# Patient Record
Sex: Male | Born: 1975 | Race: Black or African American | Hispanic: No | Smoking: Never smoker
Health system: Southern US, Community
[De-identification: ages and names within clinical notes are randomized; demographics above are authoritative.]

## PROBLEM LIST (undated history)

## (undated) DIAGNOSIS — K529 Noninfective gastroenteritis and colitis, unspecified: Secondary | ICD-10-CM

## (undated) DIAGNOSIS — F4310 Post-traumatic stress disorder, unspecified: Secondary | ICD-10-CM

## (undated) HISTORY — PX: KNEE SURGERY: SHX244

---

## 2018-08-19 ENCOUNTER — Encounter (HOSPITAL_COMMUNITY): Payer: Self-pay

## 2018-08-19 ENCOUNTER — Other Ambulatory Visit: Payer: Self-pay

## 2018-08-19 ENCOUNTER — Emergency Department (HOSPITAL_COMMUNITY): Payer: No Typology Code available for payment source

## 2018-08-19 ENCOUNTER — Emergency Department (HOSPITAL_COMMUNITY)
Admission: EM | Admit: 2018-08-19 | Discharge: 2018-08-19 | Disposition: A | Discharge status: Home / Self Care | Payer: No Typology Code available for payment source | Attending: Emergency Medicine | Admitting: Emergency Medicine

## 2018-08-19 DIAGNOSIS — F17220 Nicotine dependence, chewing tobacco, uncomplicated: Secondary | ICD-10-CM | POA: Diagnosis not present

## 2018-08-19 DIAGNOSIS — R079 Chest pain, unspecified: Secondary | ICD-10-CM | POA: Insufficient documentation

## 2018-08-19 HISTORY — DX: Noninfective gastroenteritis and colitis, unspecified: K52.9

## 2018-08-19 HISTORY — DX: Post-traumatic stress disorder, unspecified: F43.10

## 2018-08-19 LAB — BASIC METABOLIC PANEL
Anion gap: 9 (ref 5–15)
BUN: 14 mg/dL (ref 6–20)
CO2: 22 mmol/L (ref 22–32)
Calcium: 9.4 mg/dL (ref 8.9–10.3)
Chloride: 107 mmol/L (ref 98–111)
Creatinine, Ser: 1.03 mg/dL (ref 0.61–1.24)
GFR calc Af Amer: >60 mL/min (ref >60)
GFR calc non Af Amer: >60 mL/min (ref >60)
Glucose, Bld: 122 mg/dL — ABNORMAL HIGH (ref 70–99)
Potassium: 3.7 mmol/L (ref 3.5–5.1)
Sodium: 138 mmol/L (ref 135–145)

## 2018-08-19 LAB — CBC
HCT: 40.6 % (ref 39.0–52.0)
Hemoglobin: 14 g/dL (ref 13.0–17.0)
MCH: 29.9 pg (ref 26.0–34.0)
MCHC: 34.5 g/dL (ref 30.0–36.0)
MCV: 86.8 fL (ref 80.0–100.0)
Platelets: 243 10*3/uL (ref 150–400)
RBC: 4.68 MIL/uL (ref 4.22–5.81)
RDW: 12.3 % (ref 11.5–15.5)
WBC: 8.2 10*3/uL (ref 4.0–10.5)
nRBC: 0 % (ref 0.0–0.2)

## 2018-08-19 LAB — TROPONIN I: Troponin I: <0.03 ng/mL (ref <0.03)

## 2018-08-19 IMAGING — DX PORTABLE CHEST - 1 VIEW
1 series · 1 of 1 positions shown · non-contrast
Comparison: None available.

CLINICAL DATA: Initial evaluation for acute chest pain.

EXAM:
PORTABLE CHEST 1 VIEW

[chest ap]
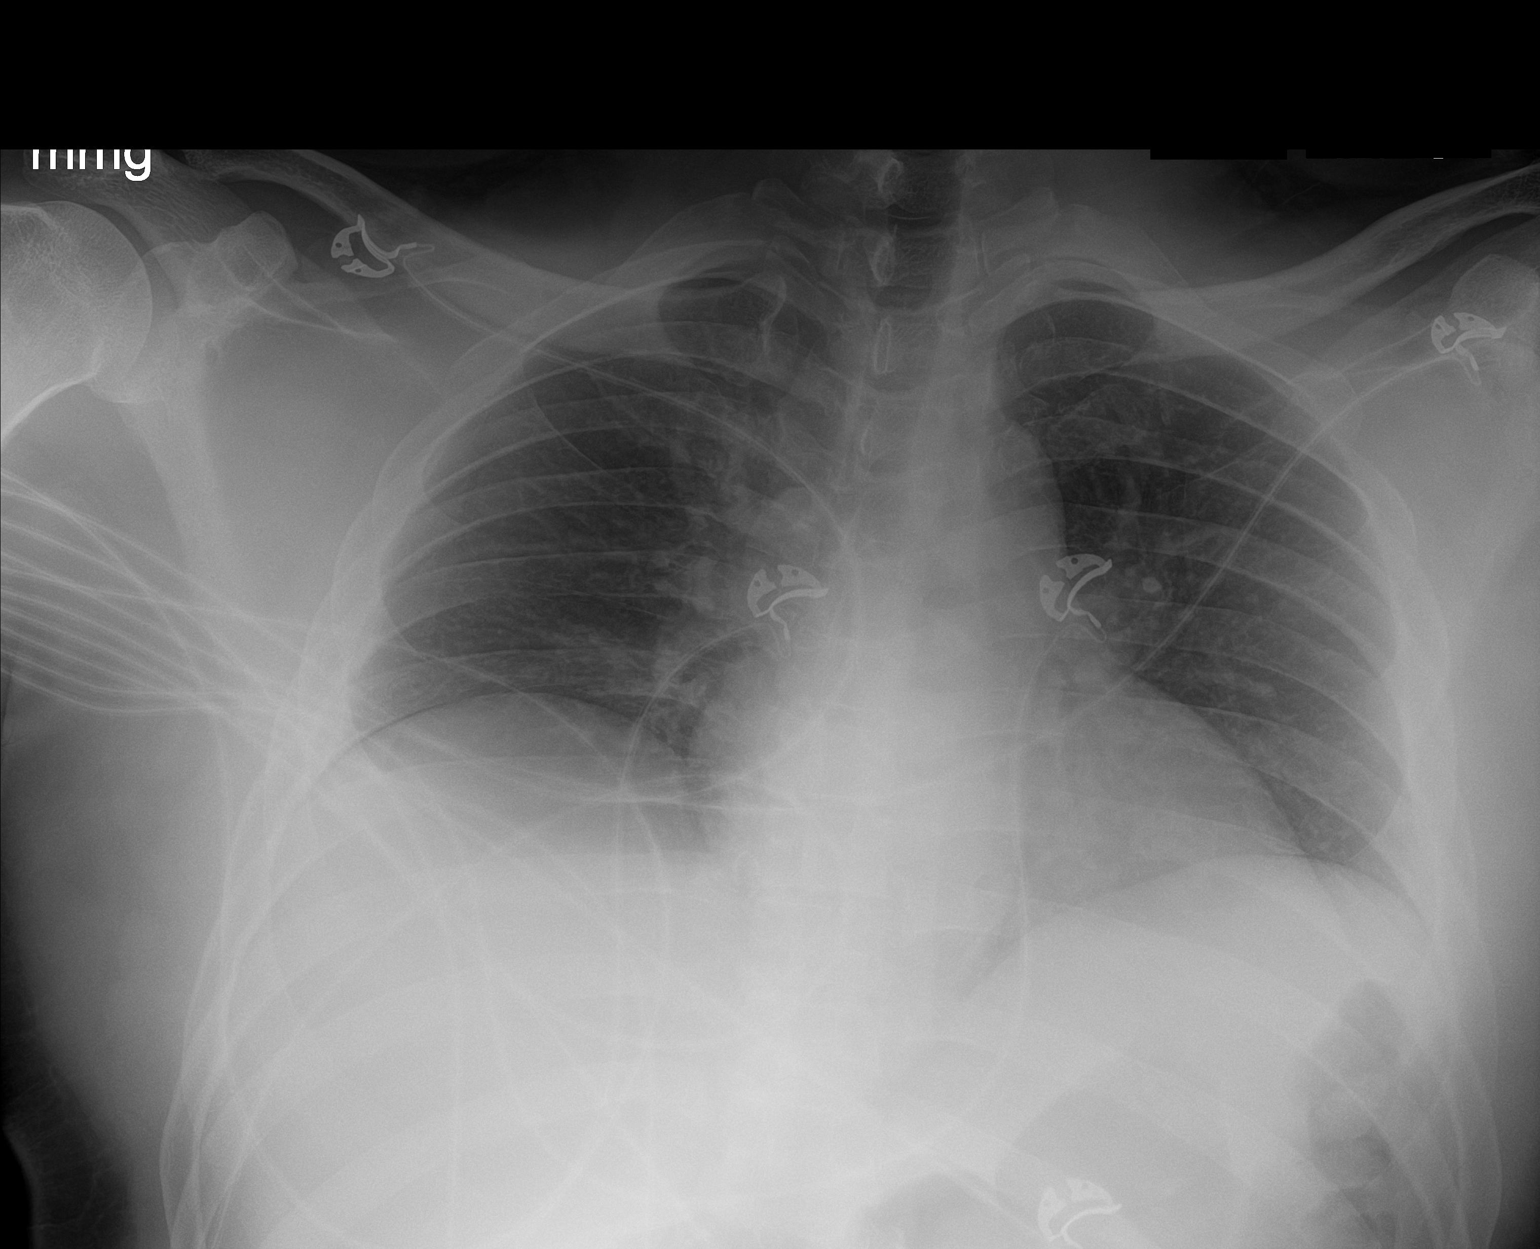

[1 of 1 positions shown; findings below may reference images not displayed]

FINDINGS: Exaggeration of the cardiac silhouette related AP technique and low
lung volumes. Transverse heart size felt to be within normal limits.
Mediastinal silhouette normal.

Lungs hypoinflated. Secondary bibasilar bronchovascular crowding. No
focal infiltrates. No pulmonary edema or pleural effusion. No
pneumothorax.

No acute osseous finding.
IMPRESSION: 1. Shallow lung inflation with secondary bibasilar bronchovascular
crowding.
2. No other active cardiopulmonary disease.

## 2018-08-19 NOTE — Discharge Instructions (Signed)
Follow up with your PCP/[facility] provider. Return to ER for new or worsening symptoms.

## 2018-08-19 NOTE — ED Provider Notes (Signed)
[facility] EMERGENCY DEPARTMENT Provider Note   CSN: [id number] Arrival date & time: 08/19/18  1459    History   Chief Complaint Chief Complaint  Patient presents with  . Chest Pain    HPI Donald Reese is a 43 y.o. male.     43yo male with complaint of SHOB, CP, headaches. Patient reports symptoms have been ongoing for several years, recently had a sleep study through the [place] and was diagnosed with sleep apnea, due to [place] has had difficulty following up and obtaining a CPAP machine. About 2-3 months ago states began to wake at night feeling like he can't breath, wakes up feeling like he's choking, heart racing, feels like he's been sprinting and then develops a headache. Last night had same symptoms at 8PM when not sleeping, occurred while sitting on the sofa watching TV. This episode prompted patient to follow up with his doctor at the [place] today, had an EKG that was abnormal and he was sent to the ER. At this time feels like he needs to take a deep breath on occasion, left side of chest feels "funny" since last night, constant, nothing makes symptoms worse, feels better when lying down. Non exertional.  Denies nausea, vomiting, abdominal pain, diaphoresis.  Denies history of high blood pressure or high cholesterol. Non smoker (states never smoker). Family history significant for MI/CVA in grandparents in old age.      Past Medical History:  Diagnosis Date  . Colitis   . PTSD (post-traumatic stress disorder)       Home Medications    Prior to Admission medications   Not on File    Family History No family history on file.  Social History Social History   Tobacco Use  . Smoking status: Never Smoker  . Smokeless tobacco: Former Systems developer    Types: Chew  Substance Use Topics  . Alcohol use: Not on file    Comment: occassionally  . Drug use: Never     Allergies   Patient has no allergy information on record.   Review of Systems Review of Systems   Constitutional: Negative for fever.  Respiratory: Positive for shortness of breath.   Cardiovascular: Positive for chest pain.  Gastrointestinal: Negative for abdominal pain, nausea and vomiting.  Skin: Negative for rash and wound.  Allergic/Immunologic: Positive for immunocompromised state.  Neurological: Positive for headaches.  Psychiatric/Behavioral: Negative for confusion.  All other systems reviewed and are negative.    Physical Exam Updated Vital Signs BP 111/72   Pulse 76   Temp 98.2 F (36.8 C) (Oral)   Resp 16   SpO2 97%   Physical Exam Vitals signs and nursing note reviewed.  Constitutional:      General: He is not in acute distress.    Appearance: He is well-developed. He is not diaphoretic.  HENT:     Head: Normocephalic and atraumatic.  Cardiovascular:     Rate and Rhythm: Normal rate and regular rhythm.     Pulses:          Carotid pulses are 2+ on the right side and 2+ on the left side.      Radial pulses are 2+ on the right side and 2+ on the left side.       Dorsalis pedis pulses are 2+ on the right side and 2+ on the left side.       Posterior tibial pulses are 2+ on the right side and 2+ on the left side.  Heart sounds: Normal heart sounds. No murmur.  Pulmonary:     Effort: Pulmonary effort is normal.     Breath sounds: Normal breath sounds. No decreased breath sounds.  Chest:     Chest wall: No tenderness.  Abdominal:     Palpations: Abdomen is soft.     Tenderness: There is no abdominal tenderness.  Musculoskeletal:     Right lower leg: No edema.     Left lower leg: No edema.  Skin:    General: Skin is warm and dry.     Findings: No erythema.  Neurological:     Mental Status: He is alert and oriented to person, place, and time.  Psychiatric:        Behavior: Behavior normal.      ED Treatments / Results  Labs (all labs ordered are listed, but only abnormal results are displayed) Labs Reviewed  BASIC METABOLIC PANEL - Abnormal;  Notable for the following components:      Result Value   Glucose, Bld 122 (*)    All other components within normal limits  CBC  TROPONIN I    EKG EKG Interpretation  Date/Time:  Friday August 19 2018 15:09:06 EDT Ventricular Rate:  83 PR Interval:    QRS Duration: 144 QT Interval:  389 QTC Calculation: 458 R Axis:   -84 Text Interpretation:  Sinus rhythm RBBB and LAFB No previous ECGs available Confirmed by Richardean CanalYao, David H ([id number]) on 08/19/2018 3:36:07 PM   Radiology Dg Chest Port 1 View  Result Date: 08/19/2018 CLINICAL DATA:  Initial evaluation for acute chest pain. EXAM: PORTABLE CHEST 1 VIEW COMPARISON:  None available. FINDINGS: Exaggeration of the cardiac silhouette related AP technique and low lung volumes. Transverse heart size felt to be within normal limits. Mediastinal silhouette normal. Lungs hypoinflated. Secondary bibasilar bronchovascular crowding. No focal infiltrates. No pulmonary edema or pleural effusion. No pneumothorax. No acute osseous finding. IMPRESSION: 1. Shallow lung inflation with secondary bibasilar bronchovascular crowding. 2. No other active cardiopulmonary disease. Electronically Signed   By: Rise MuBenjamin  McClintock M.D.   On: 08/19/2018 16:16    Procedures Procedures (including critical care time)  Medications Ordered in ED Medications - No data to display   Initial Impression / Assessment and Plan / ED Course  I have reviewed the triage vital signs and the nursing notes.  Pertinent labs & imaging results that were available during my care of the patient were reviewed by me and considered in my medical decision making (see chart for details).  Clinical Course as of Aug 19 1743  Fri Aug 19, 2018  1742 42yo male, recently diagnosed with OSA, needs CPAP but due to COVID has had delay in obtaining follow up. Reports long history of CP, SHOB, headaches that wake him from his sleep at night. [facility] since 8PM last  night, had abnormal EKG without previus for comparison and was sent to the ER.    [LM]  1743 EKG with RBBB and LAFB, no acute ischemic changes. Non smoker, no HTN, no history of high cholesterol, no significant family history. Trop negative, CBC and BMP without significant findings. Case discussed with Dr. Silverio LayYao, patient will be dc to follow up with PCP, return to ER for worsening or concerning symptoms.    [LM]    Clinical Course User Index [LM] Jeannie FendMurphy,  A, PA-C      Final Clinical Impressions(s) / ED Diagnoses   Final diagnoses:  Nonspecific  chest pain    ED Discharge Orders    None       Alden HippMurphy,  A, PA-C 08/19/18 1745    Charlynne PanderYao, David Hsienta, MD 08/19/18 [id number]

## 2018-08-19 NOTE — ED Notes (Signed)
Patient verbalizes understanding of discharge instructions. Opportunity for questioning and answers were provided. Armband removed by staff, pt discharged from ED.  

## 2018-08-19 NOTE — ED Triage Notes (Addendum)
Pt from the [place] w/ a CP that has been intermittent for the past two months. The pain is located on the left side of his chest and does not radiate. Pain is not reproducible upon palpation. Additional complaints of SOB, nausea, and lightheadedness. No vomiting. Pt reports that the pain wakes him up every morning around 2-4 am.   Pt reports recent sleep study for possible sleep apnea. Pt reports he wakes up numerous times each night with difficulty breathing, chest pain, and migraines.

## 2018-08-22 ENCOUNTER — Telehealth: Payer: Self-pay | Admitting: *Deleted

## 2018-08-22 NOTE — Telephone Encounter (Signed)
[facility] transitions coordinator called for COVID results, if any.  EDCM reviewed chart and did not find that COVID was performed.  Relayed information to transitions coordinator.

## 2019-03-15 ENCOUNTER — Encounter (HOSPITAL_COMMUNITY): Payer: Self-pay | Admitting: Emergency Medicine

## 2019-03-15 ENCOUNTER — Emergency Department (HOSPITAL_COMMUNITY)
Admission: EM | Admit: 2019-03-15 | Discharge: 2019-03-15 | Disposition: A | Discharge status: Home / Self Care | Payer: No Typology Code available for payment source | Attending: Emergency Medicine | Admitting: Emergency Medicine

## 2019-03-15 ENCOUNTER — Other Ambulatory Visit: Payer: Self-pay

## 2019-03-15 DIAGNOSIS — U071 COVID-19: Secondary | ICD-10-CM | POA: Diagnosis not present

## 2019-03-15 DIAGNOSIS — Z20822 Contact with and (suspected) exposure to covid-19: Secondary | ICD-10-CM

## 2019-03-15 DIAGNOSIS — M7918 Myalgia, other site: Secondary | ICD-10-CM | POA: Diagnosis present

## 2019-03-15 DIAGNOSIS — R05 Cough: Secondary | ICD-10-CM | POA: Diagnosis not present

## 2019-03-15 DIAGNOSIS — R0602 Shortness of breath: Secondary | ICD-10-CM | POA: Diagnosis not present

## 2019-03-15 DIAGNOSIS — R509 Fever, unspecified: Secondary | ICD-10-CM | POA: Insufficient documentation

## 2019-03-15 LAB — POC SARS CORONAVIRUS 2 AG -  ED: SARS Coronavirus 2 Ag: NEGATIVE

## 2019-03-15 MED ORDER — ACETAMINOPHEN 325 MG PO TABS
650.0000 mg | ORAL_TABLET | Freq: Once | ORAL | Status: AC | PRN
  Administered 2019-03-15: 650 mg via ORAL
  Filled 2019-03-15: qty 2

## 2019-03-15 NOTE — ED Notes (Signed)
Pt given dc instructions pt verbalizes understanding.  

## 2019-03-15 NOTE — ED Triage Notes (Signed)
Pt reports not being able to taste or smell for a couple days. Endorses generalized body aches.

## 2019-03-15 NOTE — ED Provider Notes (Signed)
[facility] EMERGENCY DEPARTMENT Provider Note   CSN: [id number] Arrival date & time: 03/15/19  1242     History Chief Complaint  Patient presents with  . Generalized Body Aches    Donald Reese is a 44 y.o. male.  HPI   44 year old male with a history of ulcerative colitis, PTSD, who presents emergency department today for evaluation of loss of smell/taste.  He also reports a mild sore throat, generalized body aches, malaise and fevers.  He has a mild intermittent cough and intermittent shortness of breath that he states is mild.  He denies any chest pain, vomiting.  He was having some loose stools earlier this week that have since resolved.  He is somewhat nauseated.  Denies any abdominal pain.  He reports he was recently around someone with Covid a few days ago.  Past Medical History:  Diagnosis Date  . Colitis   . PTSD (post-traumatic stress disorder)     There are no problems to display for this patient.   Past Surgical History:  Procedure Laterality Date  . KNEE SURGERY         No family history on file.  Social History   Tobacco Use  . Smoking status: Never Smoker  . Smokeless tobacco: Former Neurosurgeon    Types: Chew  Substance Use Topics  . Alcohol use: Not on file    Comment: occassionally  . Drug use: Never    Home Medications Prior to Admission medications   Not on File    Allergies    Patient has no allergy information on record.  Review of Systems   Review of Systems  Constitutional: Positive for fatigue and fever.  HENT: Positive for sore throat. Negative for congestion.   Eyes: Negative for visual disturbance.  Respiratory: Positive for cough. Negative for shortness of breath.   Cardiovascular: Negative for chest pain.  Gastrointestinal: Positive for diarrhea (resolved) and nausea. Negative for abdominal pain and vomiting.  Genitourinary: Negative for dysuria.  Musculoskeletal: Negative for back pain.  Skin: Negative for  rash.  Neurological: Negative for light-headedness.    Physical Exam Updated Vital Signs BP 125/82   Pulse 97   Temp (!) 100.5 F (38.1 C) (Oral)   Resp 19   SpO2 100%   Physical Exam Vitals and nursing note reviewed.  Constitutional:      Appearance: He is well-developed.  HENT:     Head: Normocephalic and atraumatic.  Eyes:     Conjunctiva/sclera: Conjunctivae normal.  Cardiovascular:     Rate and Rhythm: Normal rate and regular rhythm.     Heart sounds: Normal heart sounds. No murmur.  Pulmonary:     Effort: Pulmonary effort is normal. No respiratory distress.     Breath sounds: Normal breath sounds. No wheezing, rhonchi or rales.  Abdominal:     General: Bowel sounds are normal.     Palpations: Abdomen is soft.     Tenderness: There is no abdominal tenderness. There is no guarding.  Musculoskeletal:     Cervical back: Neck supple.  Skin:    General: Skin is warm and dry.  Neurological:     Mental Status: He is alert.     ED Results / Procedures / Treatments   Labs (all labs ordered are listed, but only abnormal results are displayed) Labs Reviewed  NOVEL CORONAVIRUS, NAA (HOSP ORDER, SEND-OUT TO REF LAB; TAT 18-24 HRS)  POC SARS CORONAVIRUS 2 AG -  ED    EKG None  Radiology No results found.  Procedures Procedures (including critical care time)  Medications Ordered in ED Medications  acetaminophen (TYLENOL) tablet 650 mg (650 mg Oral Given 03/15/19 1257)    ED Course  I have reviewed the triage vital signs and the nursing notes.  Pertinent labs & imaging results that were available during my care of the patient were reviewed by me and considered in my medical decision making (see chart for details).    MDM Rules/Calculators/A&P                      Patient presenting for evaluation for Covid.  Reports symptoms ongoing for several days.  Patient nontoxic, well-appearing, no distress.  Vital signs are reassuring.  Tested for Covid in the ED.  Results of the POC test was negative however still have high clinical suspicion for COVID therefore will obtain send out test which is more sensitive. Advised on quarantine measures. Will give Rx for symptomatic management. Advised on f/u and return precautions. Pt voiced understanding of the plan and reasons to return. All questions answered, pt stable for d/c.  ---  [name] was evaluated in Emergency Department on 03/15/2019 for the symptoms described in the history of present illness. He was evaluated in the context of the global COVID-19 pandemic, which necessitated consideration that the patient might be at risk for infection with the SARS-CoV-2 virus that causes COVID-19. Institutional protocols and algorithms that pertain to the evaluation of patients at risk for COVID-19 are in a state of rapid change based on information released by regulatory bodies including the CDC and federal and state organizations. These policies and algorithms were followed during the patient's care in the ED.   Final Clinical Impression(s) / ED Diagnoses Final diagnoses:  Person under investigation for COVID-19    Rx / DC Orders ED Discharge Orders    None       Rodney Booze, PA-C 03/15/19 [place], J[place], DO 03/15/19 1612

## 2019-03-15 NOTE — Discharge Instructions (Signed)
Today you were were tested for the coronavirus.  The results will be available in the next 2-3 days.  If the results are positive the hospital will contact you.  If they are negative the hospital would not contact you.  You will need to self quarantine until you are aware of your results.  If they are positive you will need to self quarantine as directed below.  You should be isolated for at least 7 days since the onset of your symptoms AND >72 hours after symptoms resolution (absence of fever without the use of fever reducing medication and improvement in respiratory symptoms), whichever is longer  Please follow up with your primary care provider within 5-7 days for re-evaluation of your symptoms. If you do not have a primary care provider, information for a healthcare clinic has been provided for you to make arrangements for follow up care. Please return to the emergency department for any new or worsening symptoms.   

## 2019-03-16 LAB — NOVEL CORONAVIRUS, NAA (HOSP ORDER, SEND-OUT TO REF LAB; TAT 18-24 HRS): SARS-CoV-2, NAA: DETECTED — AB

## 2019-03-17 ENCOUNTER — Telehealth (HOSPITAL_COMMUNITY): Payer: Self-pay

## 2021-07-04 ENCOUNTER — Ambulatory Visit (INDEPENDENT_AMBULATORY_CARE_PROVIDER_SITE_OTHER): Payer: No Typology Code available for payment source

## 2021-07-04 ENCOUNTER — Encounter (HOSPITAL_COMMUNITY): Payer: Self-pay

## 2021-07-04 ENCOUNTER — Ambulatory Visit (HOSPITAL_COMMUNITY)
Admission: EM | Admit: 2021-07-04 | Discharge: 2021-07-04 | Disposition: A | Discharge status: Home / Self Care | Payer: No Typology Code available for payment source

## 2021-07-04 DIAGNOSIS — G8929 Other chronic pain: Secondary | ICD-10-CM | POA: Diagnosis not present

## 2021-07-04 DIAGNOSIS — M545 Low back pain, unspecified: Secondary | ICD-10-CM | POA: Diagnosis not present

## 2021-07-04 DIAGNOSIS — M5441 Lumbago with sciatica, right side: Secondary | ICD-10-CM

## 2021-07-04 IMAGING — DX DG LUMBAR SPINE COMPLETE 4+V
5 series · 5 of 5 positions shown · non-contrast
Comparison: None Available.

CLINICAL DATA: Chronic low back pain, recently worsened.

EXAM:
LUMBAR SPINE - COMPLETE 4+ VIEW

[l-spine ap]
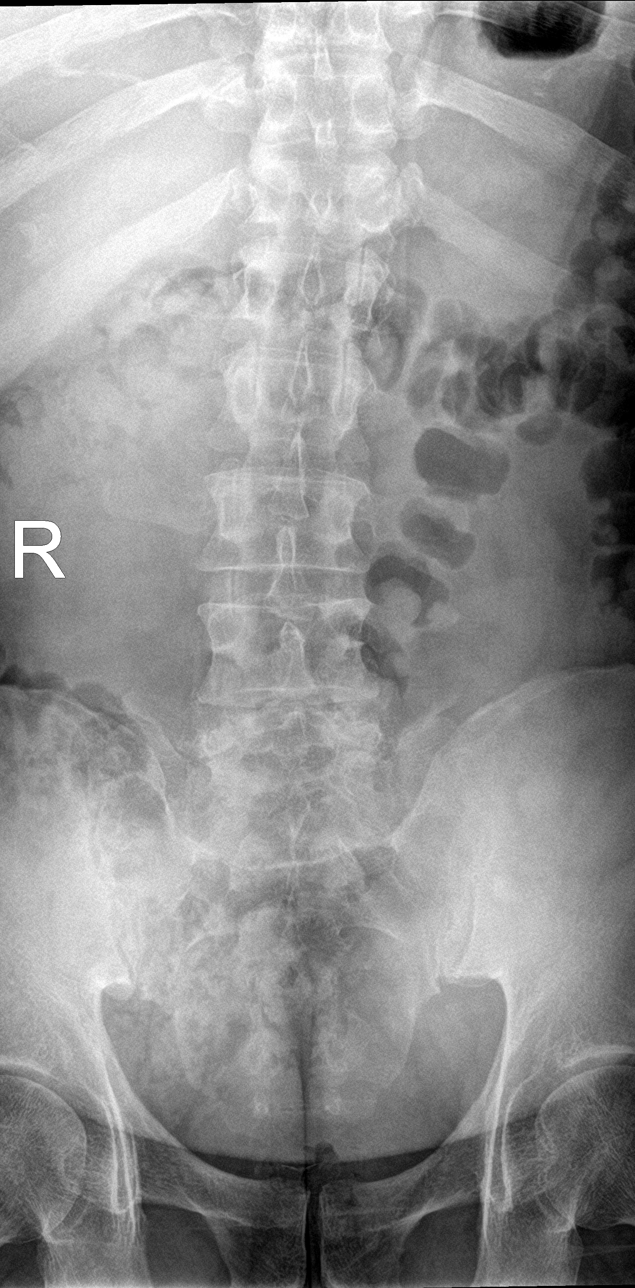

[l-spine obl (1 of 3)]
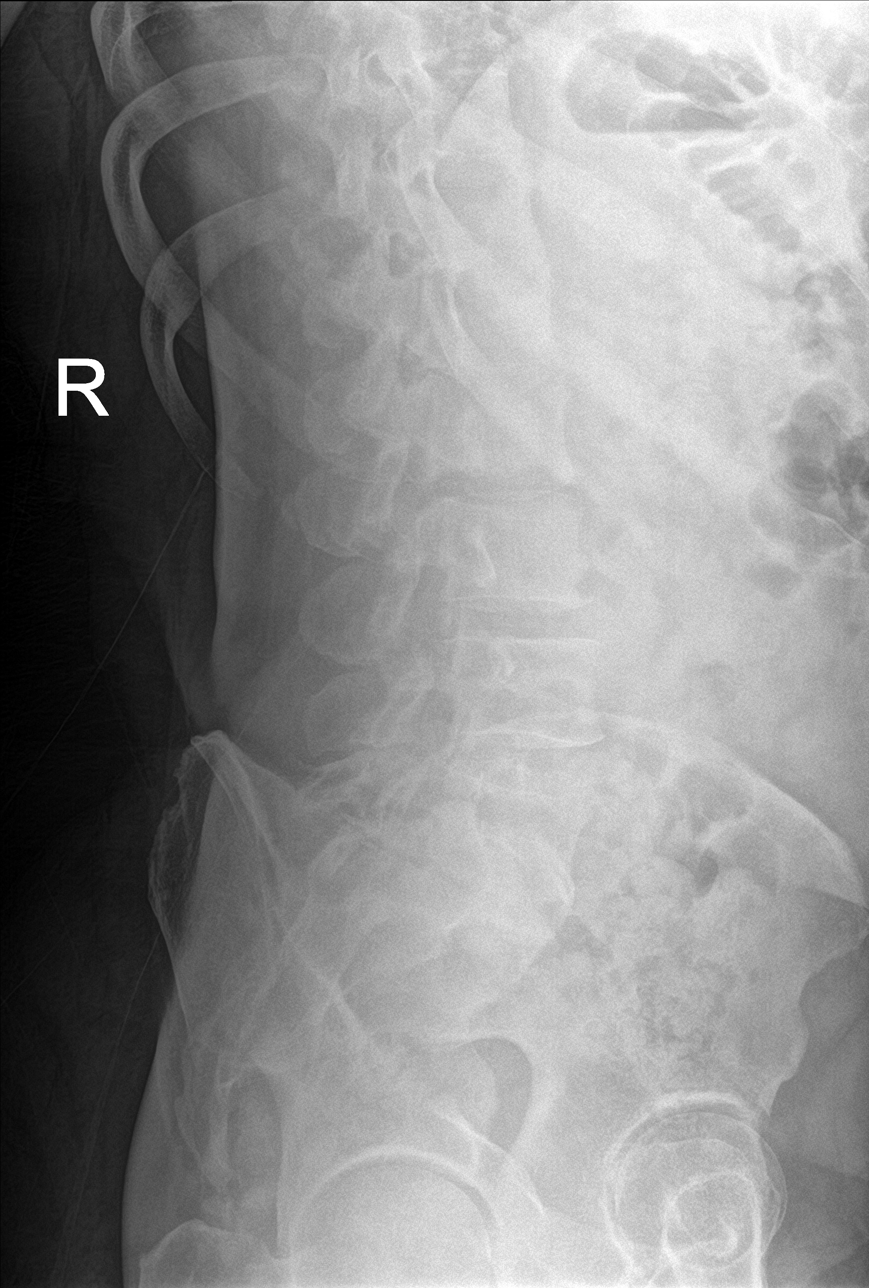

[l-spine obl (2 of 3)]
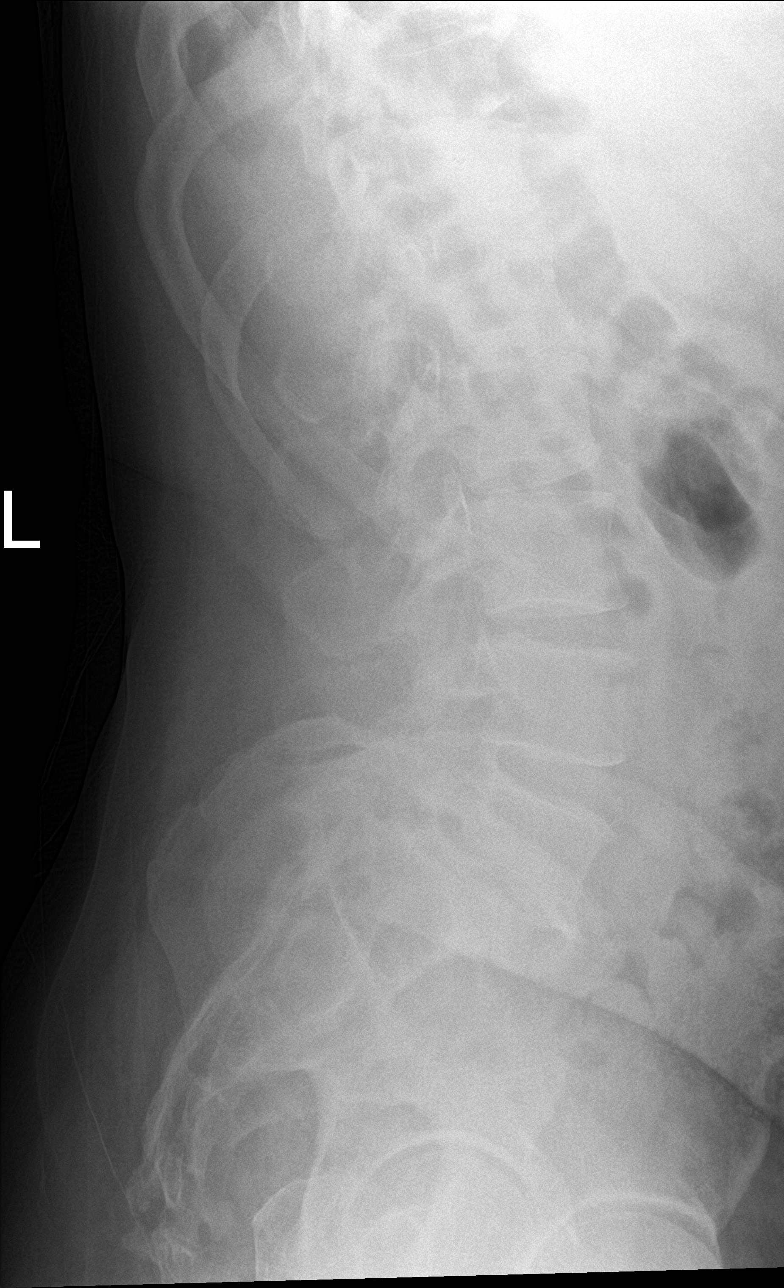

[l-spine obl (3 of 3)]
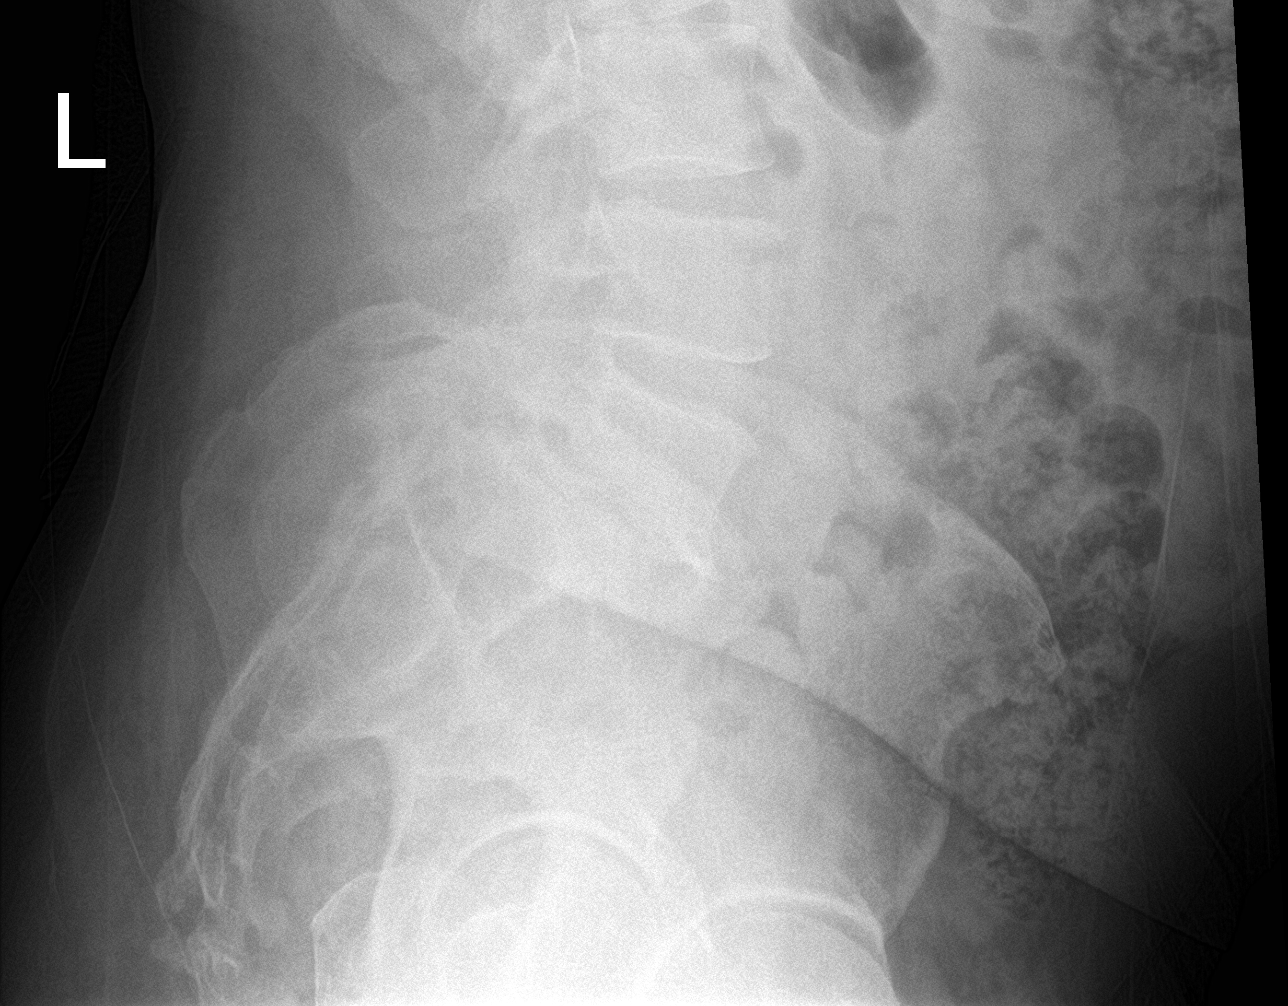

[l-spine lat]
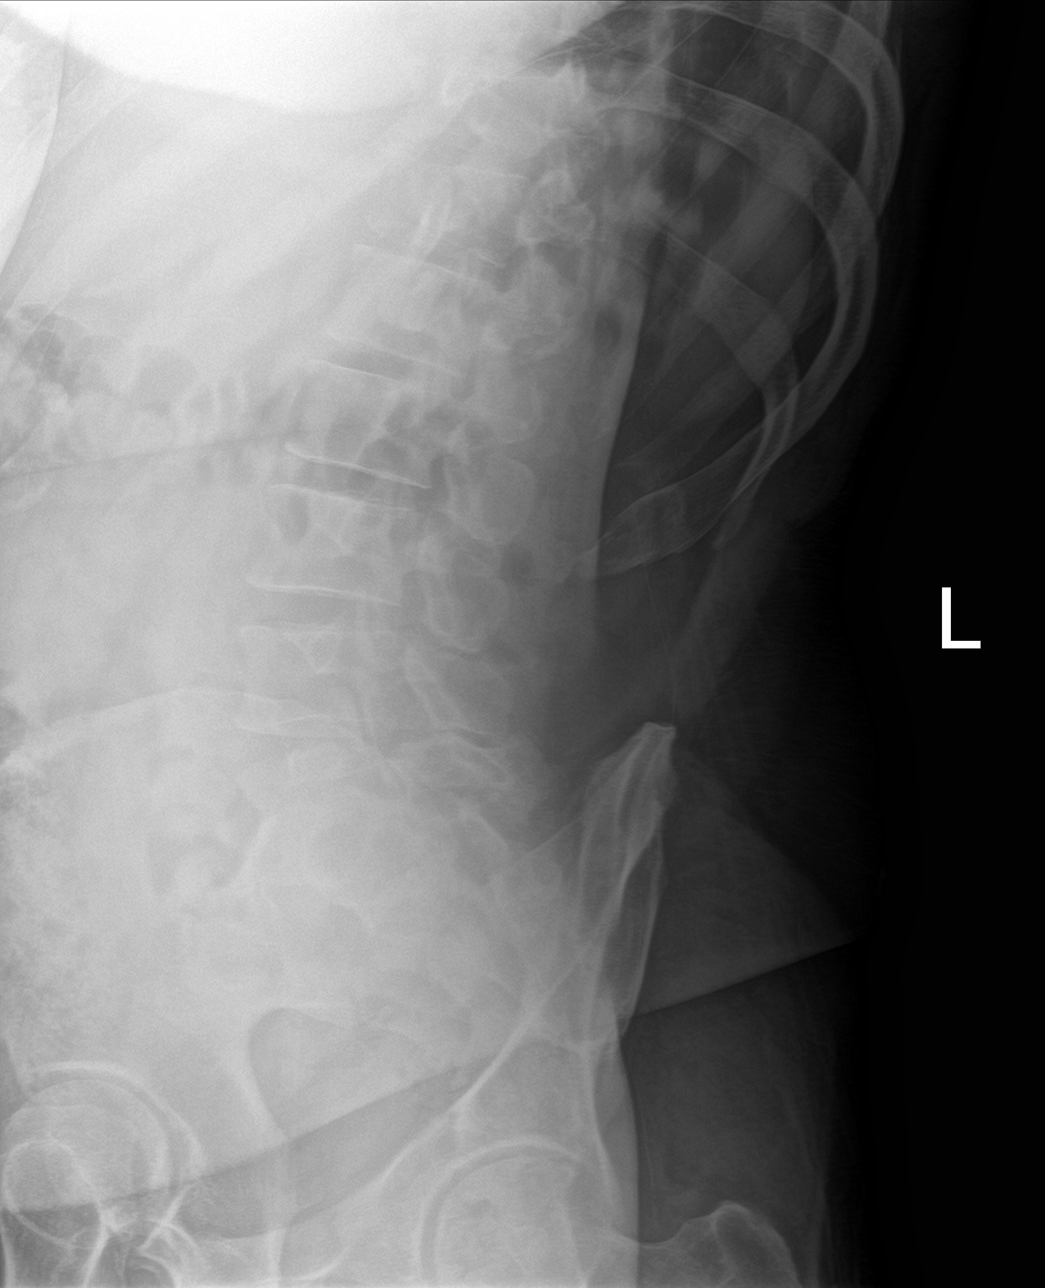

[5 of 5 positions shown; findings below may reference images not displayed]

FINDINGS: Mild lumbar curvature convex to the right. Anterolisthesis at L5-S1
of 10-15 mm. Detail is quite limited. I presume there are bilateral
pars defects or advanced facet arthropathy at L5-S1. No evidence of
regional fracture. Disc space narrowing at L5-S1.
IMPRESSION: 10-15 mm of anterolisthesis at L5-S1. Disc space narrowing. Detail
is limited. Findings presumed due to pars defects, but chronic facet
arthropathy could also be the cause.

## 2021-07-04 MED ORDER — KETOROLAC TROMETHAMINE 30 MG/ML IJ SOLN
INTRAMUSCULAR | Status: AC
  Filled 2021-07-04: qty 1

## 2021-07-04 MED ORDER — KETOROLAC TROMETHAMINE 30 MG/ML IJ SOLN
30.0000 mg | Freq: Once | INTRAMUSCULAR | Status: AC
  Administered 2021-07-04: 30 mg via INTRAMUSCULAR

## 2021-07-04 MED ORDER — CYCLOBENZAPRINE HCL 10 MG PO TABS: 10.0000 mg | ORAL_TABLET | Freq: Two times a day (BID) | ORAL | 0 refills | Status: AC | PRN

## 2021-07-04 MED ORDER — PREDNISONE 10 MG PO TABS: 10.0000 mg | ORAL_TABLET | Freq: Every day | ORAL | 0 refills | Status: AC

## 2021-07-04 NOTE — ED Provider Notes (Signed)
?[facility] ? ? ? ?CSN: [id number] ?Arrival date & time: 07/04/21  1444 ? ? ?  ? ?History   ?Chief Complaint ?Chief Complaint  ?Patient presents with  ? Back Pain  ? ? ?HPI ?Donald Reese is a 46 y.o. male.  ? ?Patient presents for worsening chronic left sided low back pain that has worsened over the past couple of weeks.  Denies recent trauma, accident, or injury.  Reports the pain is severe and describes the pain as sharp and shooting.  Reports the pain radiates down  right leg.  Has used Tylenol for the pain with mild relief.  Denies saddle anesthesia, bowel/bladder incontinence, fevers, nausea/vomiting, and dysuria/urinary frequency.  Endorses some numbness/tingling in his right foot.   ? ?Reports he cannot take NSAIDs as he has a history of ulcerative colitis.  Patient reports he has had multiple scans of his low back, and has a history of degenerative disc. ? ? ?Past Medical History:  ?Diagnosis Date  ? Colitis   ? PTSD (post-traumatic stress disorder)   ? ? ?There are no problems to display for this patient. ? ? ?Past Surgical History:  ?Procedure Laterality Date  ? KNEE SURGERY    ? ? ? ? ? ?Home Medications   ? ?Prior to Admission medications   ?Medication Sig Start Date End Date Taking? Authorizing Provider  ?cloNIDine (CATAPRES) 0.1 MG tablet Take by mouth. 07/16/20  Yes [provider]  ?cyclobenzaprine (FLEXERIL) 10 MG tablet Take 1 tablet (10 mg total) by mouth 2 (two) times daily as needed for muscle spasms. Do not take while driving or operating heavy machinery 07/04/21  Yes Valentino NoseMartinez, Ltanya Bayley A, NP  ?predniSONE (DELTASONE) 10 MG tablet Take 1 tablet (10 mg total) by mouth daily with breakfast for 5 days. Take 6 tablets by mouth daily for 2 days, then reduce by 1 tablet every 2 days until gone 07/04/21 07/09/21 Yes Valentino NoseMartinez, Christie Copley A, NP  ?traZODone (DESYREL) 100 MG tablet Take by mouth. 10/30/20  Yes [provider]  ?zolpidem (AMBIEN) 5 MG tablet Take 5 mg by mouth at bedtime as  needed for sleep.   Yes [provider]  ?busPIRone (BUSPAR) 10 MG tablet Take by mouth.    [provider]  ?escitalopram (LEXAPRO) 10 MG tablet Take by mouth.    [provider]  ?Lurasidone HCl 120 MG TABS Take by mouth.    [provider]  ?melatonin 3 MG TABS tablet Take by mouth.    [provider]  ? ? ?Family History ?Family History  ?Problem Relation Age of Onset  ? Healthy Mother   ? ? ?Social History ?Social History  ? ?Tobacco Use  ? Smoking status: Never  ? Smokeless tobacco: Former  ?  Types: Chew  ?Vaping Use  ? Vaping Use: Never used  ?Substance Use Topics  ? Drug use: Never  ? ? ? ?Allergies   ?Iodine ? ? ?Review of Systems ?Review of Systems ?Per HPI ? ?Physical Exam ?Triage Vital Signs ?ED Triage Vitals  ?Enc Vitals Group  ?   BP 07/04/21 1555 (!) 151/98  ?   Pulse Rate 07/04/21 1555 83  ?   Resp 07/04/21 1555 18  ?   Temp 07/04/21 1555 97.9 ?F (36.6 ?C)  ?   Temp Source 07/04/21 1555 Oral  ?   SpO2 07/04/21 1555 94 %  ?   Weight --   ?   Height --   ?   Head Circumference --   ?  Peak Flow --   ?   Pain Score 07/04/21 1554 10  ?   Pain Loc --   ?   Pain Edu? --   ?   Excl. in GC? --   ? ?No data found. ? ?Updated Vital Signs ?BP (!) 151/98 (BP Location: Right Arm)   Pulse 83   Temp 97.9 ?F (36.6 ?C) (Oral)   Resp 18   SpO2 94%  ? ?Visual Acuity ?Right Eye Distance:   ?Left Eye Distance:   ?Bilateral Distance:   ? ?Right Eye Near:   ?Left Eye Near:    ?Bilateral Near:    ? ?Physical Exam ?Vitals and nursing note reviewed.  ?Constitutional:   ?   General: He is not in acute distress. ?   Appearance: Normal appearance. He is not toxic-appearing.  ?Pulmonary:  ?   Effort: Pulmonary effort is normal. No respiratory distress.  ?Musculoskeletal:  ?   Right lower leg: No tenderness or bony tenderness. No edema.  ?   Left lower leg: No tenderness or bony tenderness. No edema.  ?   Right ankle: Normal pulse.  ?   Left ankle: Normal pulse.  ?   Right foot:  Normal capillary refill. Normal pulse.  ?   Left foot: Normal capillary refill. Normal pulse.  ?Skin: ?   General: Skin is warm and dry.  ?   Capillary Refill: Capillary refill takes less than 2 seconds.  ?   Coloration: Skin is not jaundiced or pale.  ?   Findings: No erythema.  ?Neurological:  ?   Mental Status: He is alert and oriented to person, place, and time.  ?   Motor: No weakness.  ?   Gait: Gait normal.  ?Psychiatric:     ?   Behavior: Behavior is cooperative.  ? ? ? ?UC Treatments / Results  ?Labs ?(all labs ordered are listed, but only abnormal results are displayed) ?Labs Reviewed - No data to display ? ?EKG ? ? ?Radiology ?DG Lumbar Spine Complete ? ?Result Date: 07/04/2021 ?CLINICAL DATA:  Chronic low back pain, recently worsened. EXAM: LUMBAR SPINE - COMPLETE 4+ VIEW COMPARISON:  None Available. FINDINGS: Mild lumbar curvature convex to the right. Anterolisthesis at L5-S1 of 10-15 mm. Detail is quite limited. I presume there are bilateral pars defects or advanced facet arthropathy at L5-S1. No evidence of regional fracture. Disc space narrowing at L5-S1. IMPRESSION: 10-15 mm of anterolisthesis at L5-S1. Disc space narrowing. Detail is limited. Findings presumed due to pars defects, but chronic facet arthropathy could also be the cause. Electronically Signed   By: Paulina Fusi M.D.   On: 07/04/2021 16:39   ? ?Procedures ?Procedures (including critical care time) ? ?Medications Ordered in UC ?Medications  ?ketorolac (TORADOL) 30 MG/ML injection 30 mg (30 mg Intramuscular Given 07/04/21 1712)  ? ? ?Initial Impression / Assessment and Plan / UC Course  ?I have reviewed the triage vital signs and the nursing notes. ? ?Pertinent labs & imaging results that were available during my care of the patient were reviewed by me and considered in my medical decision making (see chart for details). ? ?  ?Lumbar x-ray today shows anterolisthesis, some disc space narrowing.  Suspect this is contributing to his pain.  We  will give injection of Toradol 30 mg IM today in urgent care to help with pain control.  Can continue Tylenol lidocaine patches at home.  Prescription given for cyclobenzaprine 10 mg daily as needed for muscle pain.  Also start low-dose prednisone, 10 milligrams daily for 5 days, he reports mood disturbances with high doses.  Encourage close follow-up with neurosurgery-contact information given.  The patient was given the opportunity to ask questions.  All questions answered to their satisfaction.  The patient is in agreement to this plan.  ? ?Final Clinical Impressions(s) / UC Diagnoses  ? ?Final diagnoses:  ?Acute left-sided low back pain with right-sided sciatica  ? ? ? ?Discharge Instructions   ? ?  ?- The x-ray of your low back shows a possible slipped disc  ?- We have given you a shot of Toradol 30 mg today to help with the pain ?- You can continue to use Tylenol and lidocaine patches ?- You can also use cyclobenzaprine 10 mg twice daily as needed for muscle pain; be careful as this medicine may make you sleepy  ?- Please also start the low dose prednisone to help with inflammation in your back ? ? ? ? ?ED Prescriptions   ? ? Medication Sig Dispense Auth. Provider  ? cyclobenzaprine (FLEXERIL) 10 MG tablet Take 1 tablet (10 mg total) by mouth 2 (two) times daily as needed for muscle spasms. Do not take while driving or operating heavy machinery 30 tablet Cathlean Marseilles A, NP  ? predniSONE (DELTASONE) 10 MG tablet Take 1 tablet (10 mg total) by mouth daily with breakfast for 5 days. Take 6 tablets by mouth daily for 2 days, then reduce by 1 tablet every 2 days until gone 5 tablet Valentino Nose, NP  ? ?  ? ?PDMP not reviewed this encounter. ?  ?Valentino Nose, NP ?07/04/21 2109 ? ?

## 2021-07-04 NOTE — Discharge Instructions (Addendum)
-   The x-ray of your low back shows a possible slipped disc  ?- We have given you a shot of Toradol 30 mg today to help with the pain ?- You can continue to use Tylenol and lidocaine patches ?- You can also use cyclobenzaprine 10 mg twice daily as needed for muscle pain; be careful as this medicine may make you sleepy  ?- Please also start the low dose prednisone to help with inflammation in your back ?

## 2021-07-04 NOTE — ED Triage Notes (Signed)
Pt reports long h/o low back pain that has worsened within the last 2wks.  ?Notes n/t in BLE, right > left. Has been taking tylenol w/o relief. No falls or injuries. Walking aggravates sxs. ?

## 2021-08-02 ENCOUNTER — Emergency Department (HOSPITAL_COMMUNITY): Payer: No Typology Code available for payment source

## 2021-08-02 ENCOUNTER — Encounter (HOSPITAL_COMMUNITY): Payer: Self-pay

## 2021-08-02 ENCOUNTER — Other Ambulatory Visit: Payer: Self-pay

## 2021-08-02 ENCOUNTER — Emergency Department (HOSPITAL_COMMUNITY)
Admission: EM | Admit: 2021-08-02 | Discharge: 2021-08-02 | Disposition: A | Discharge status: Home / Self Care | Payer: No Typology Code available for payment source | Attending: Emergency Medicine | Admitting: Emergency Medicine

## 2021-08-02 DIAGNOSIS — E876 Hypokalemia: Secondary | ICD-10-CM | POA: Diagnosis not present

## 2021-08-02 DIAGNOSIS — R7309 Other abnormal glucose: Secondary | ICD-10-CM | POA: Insufficient documentation

## 2021-08-02 DIAGNOSIS — R0989 Other specified symptoms and signs involving the circulatory and respiratory systems: Secondary | ICD-10-CM

## 2021-08-02 DIAGNOSIS — I10 Essential (primary) hypertension: Secondary | ICD-10-CM | POA: Insufficient documentation

## 2021-08-02 DIAGNOSIS — R0789 Other chest pain: Secondary | ICD-10-CM | POA: Insufficient documentation

## 2021-08-02 DIAGNOSIS — R079 Chest pain, unspecified: Secondary | ICD-10-CM | POA: Diagnosis present

## 2021-08-02 DIAGNOSIS — R202 Paresthesia of skin: Secondary | ICD-10-CM | POA: Diagnosis not present

## 2021-08-02 LAB — CBC
HCT: 39.5 % (ref 39.0–52.0)
Hemoglobin: 13.7 g/dL (ref 13.0–17.0)
MCH: 30.4 pg (ref 26.0–34.0)
MCHC: 34.7 g/dL (ref 30.0–36.0)
MCV: 87.6 fL (ref 80.0–100.0)
Platelets: 256 10*3/uL (ref 150–400)
RBC: 4.51 MIL/uL (ref 4.22–5.81)
RDW: 12.4 % (ref 11.5–15.5)
WBC: 9.1 10*3/uL (ref 4.0–10.5)
nRBC: 0 % (ref 0.0–0.2)

## 2021-08-02 LAB — BASIC METABOLIC PANEL
Anion gap: 6 (ref 5–15)
BUN: 14 mg/dL (ref 6–20)
CO2: 25 mmol/L (ref 22–32)
Calcium: 9.6 mg/dL (ref 8.9–10.3)
Chloride: 108 mmol/L (ref 98–111)
Creatinine, Ser: 0.98 mg/dL (ref 0.61–1.24)
GFR, Estimated: >60 mL/min (ref >60)
Glucose, Bld: 134 mg/dL — ABNORMAL HIGH (ref 70–99)
Potassium: 3.4 mmol/L — ABNORMAL LOW (ref 3.5–5.1)
Sodium: 139 mmol/L (ref 135–145)

## 2021-08-02 LAB — TROPONIN I (HIGH SENSITIVITY)
Troponin I (High Sensitivity): 6 ng/L (ref <18)
Troponin I (High Sensitivity): 6 ng/L (ref <18)

## 2021-08-02 IMAGING — CT CT CERVICAL SPINE W/O CM
3 series · 14 of 29 positions shown, 16 images · non-contrast
Comparison: None Available.

CLINICAL DATA: Sudden onset of dizziness and left arm numbness with
chest pain.



[Series 5: c spine soft · axial · 0.45mm/px · z∈[+1186,+1294]mm · 5 of 95 slices shown]
[im 14/95  soft-tissue]
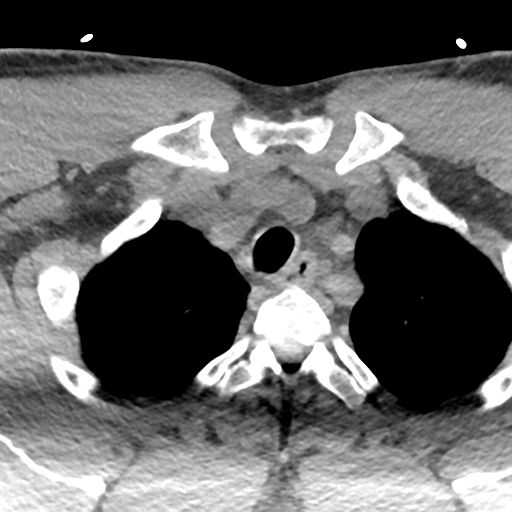
[im 27/95  soft-tissue]
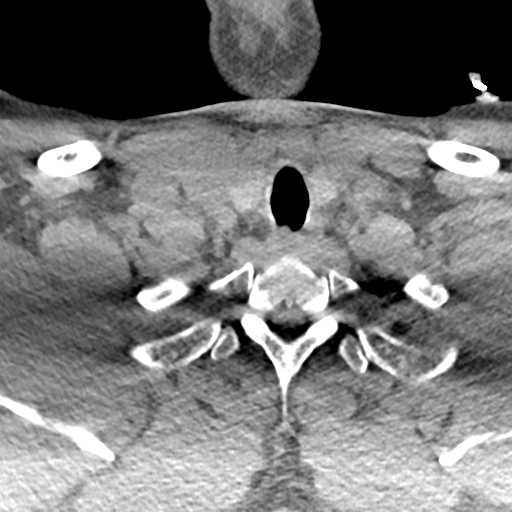
[im 41/95  soft-tissue]
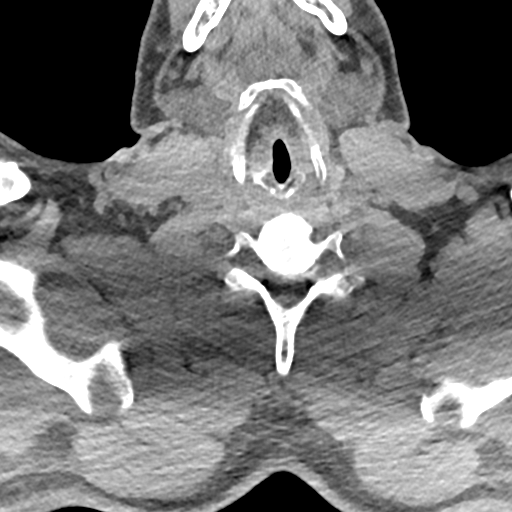
[im 54/95  soft-tissue]
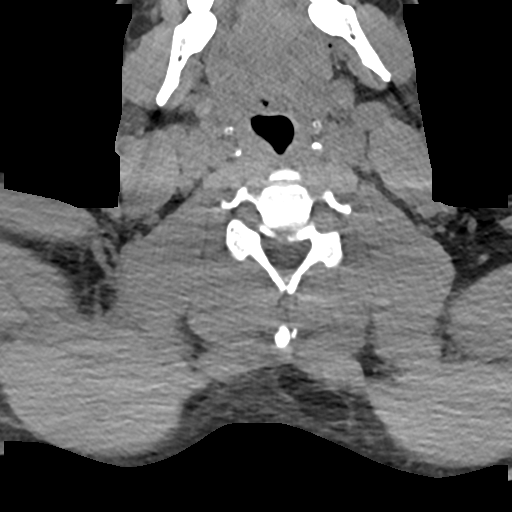
[im 68/95  soft-tissue]
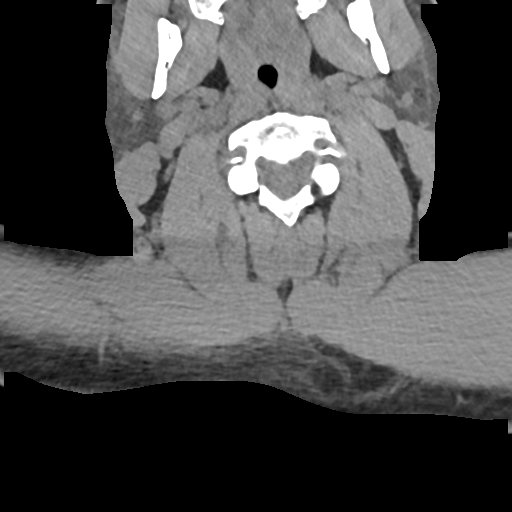

[Series 8: sag bone · sagittal · 0.29mm/px · 5 of 83 slices shown, 6 images]
[im 28/83  bone]
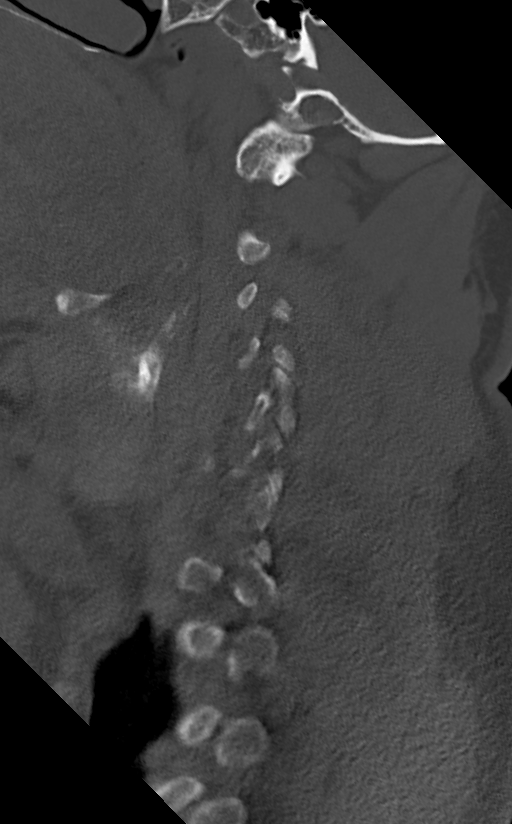
[im 35/83  bone]
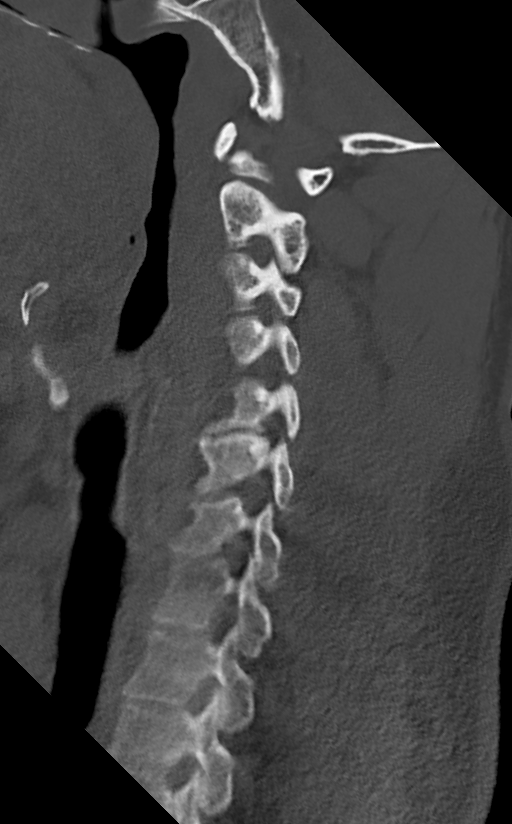
[im 42/83  soft-tissue]
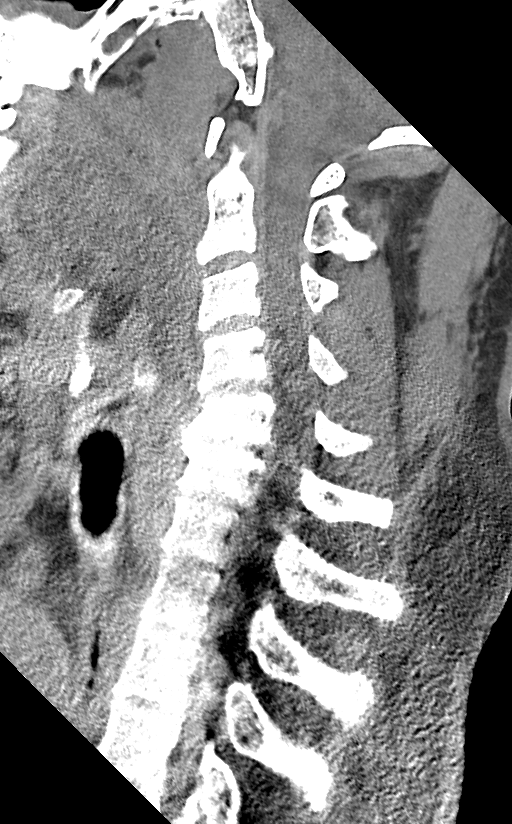
[im 42/83  bone]
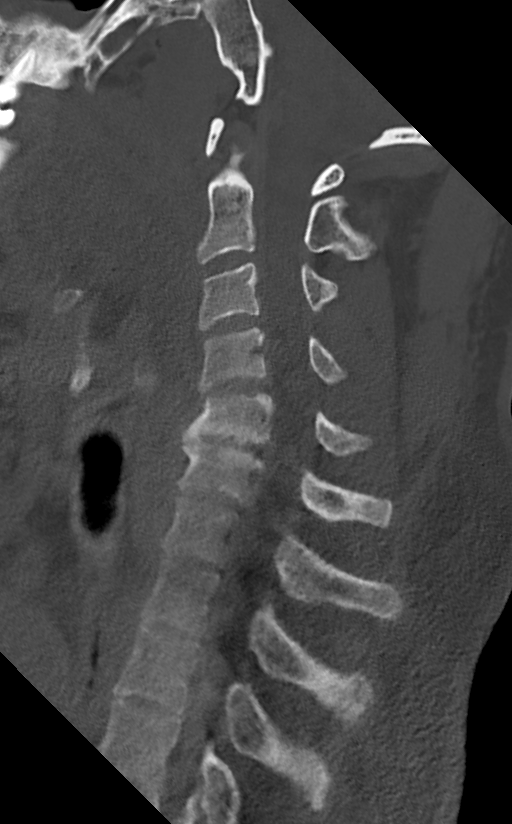
[im 48/83  bone]
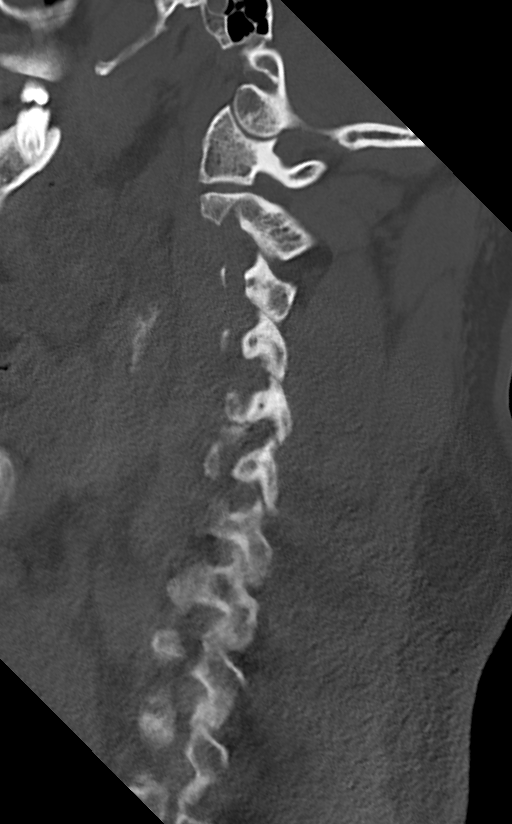
[im 55/83  bone]
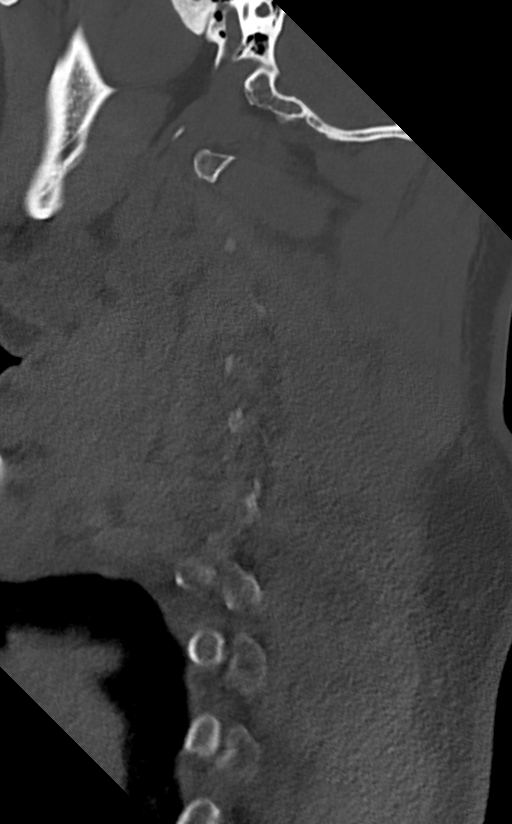

[Series 10: orthogonal axials · axial · 0.21mm/px · z∈[+1173,+1244]mm · 4 of 79 slices shown, 5 images]
[im 16/79  soft-tissue]
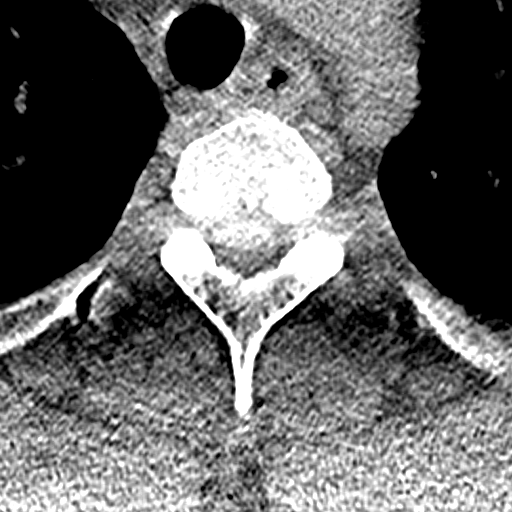
[im 16/79  bone]
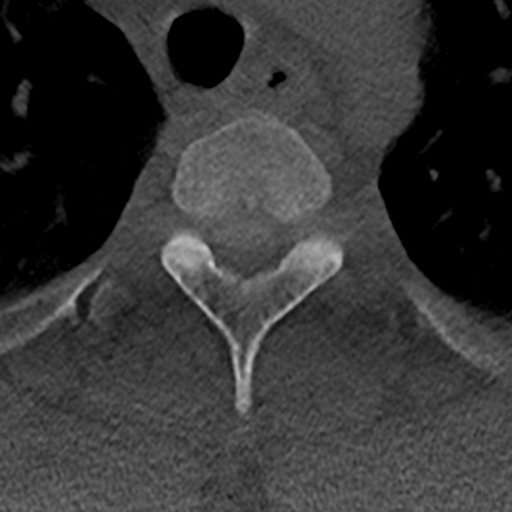
[im 32/79  bone]
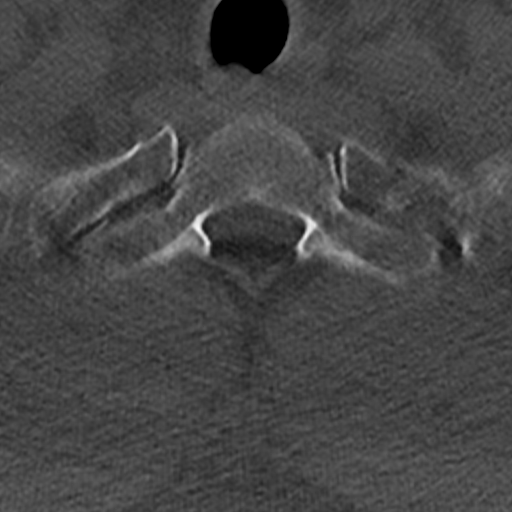
[im 47/79  bone]
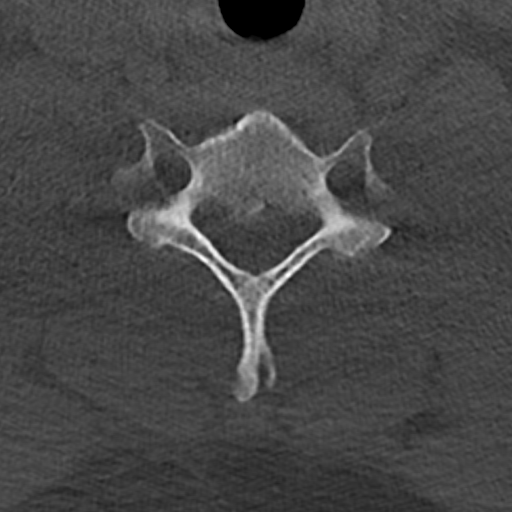
[im 63/79  bone]
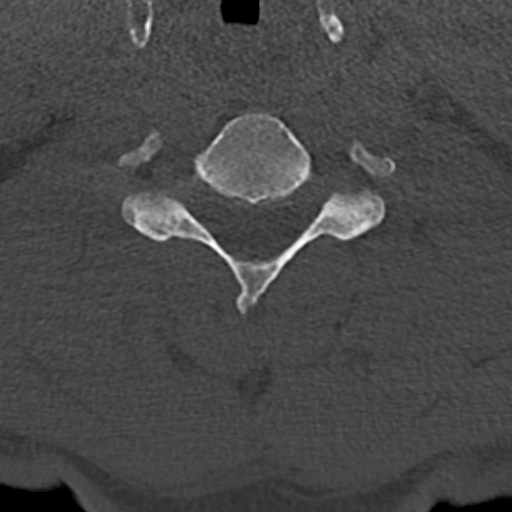

[14 of 29 positions shown; findings below may reference images not displayed]

FINDINGS: Alignment: There is mild reversal of the normal cervical spinal
lordosis.

Skull base and vertebrae: No acute fracture. No primary bone lesion
or focal pathologic process.

Soft tissues and spinal canal: No prevertebral fluid or swelling. No
visible canal hematoma.

Disc levels: There is moderate to marked severity endplate sclerosis
and anterior osteophyte formation at the level of C5-C6.

Mild intervertebral disc space narrowing is seen at C5-C6.

Mild bilateral facet joint hypertrophy is seen at the level of
C5-C6.

Upper chest: Negative.

Other: None.
IMPRESSION: 1. No acute fracture or subluxation in the cervical spine.
2. Moderate to marked severity degenerative changes at the level of
C5-C6.

## 2021-08-02 IMAGING — CR DG CHEST 2V
2 series · 2 of 2 positions shown · non-contrast
Comparison: [DATE]

CLINICAL DATA: Chest pain and dizziness.

EXAM:
CHEST - 2 VIEW

[chest pa]
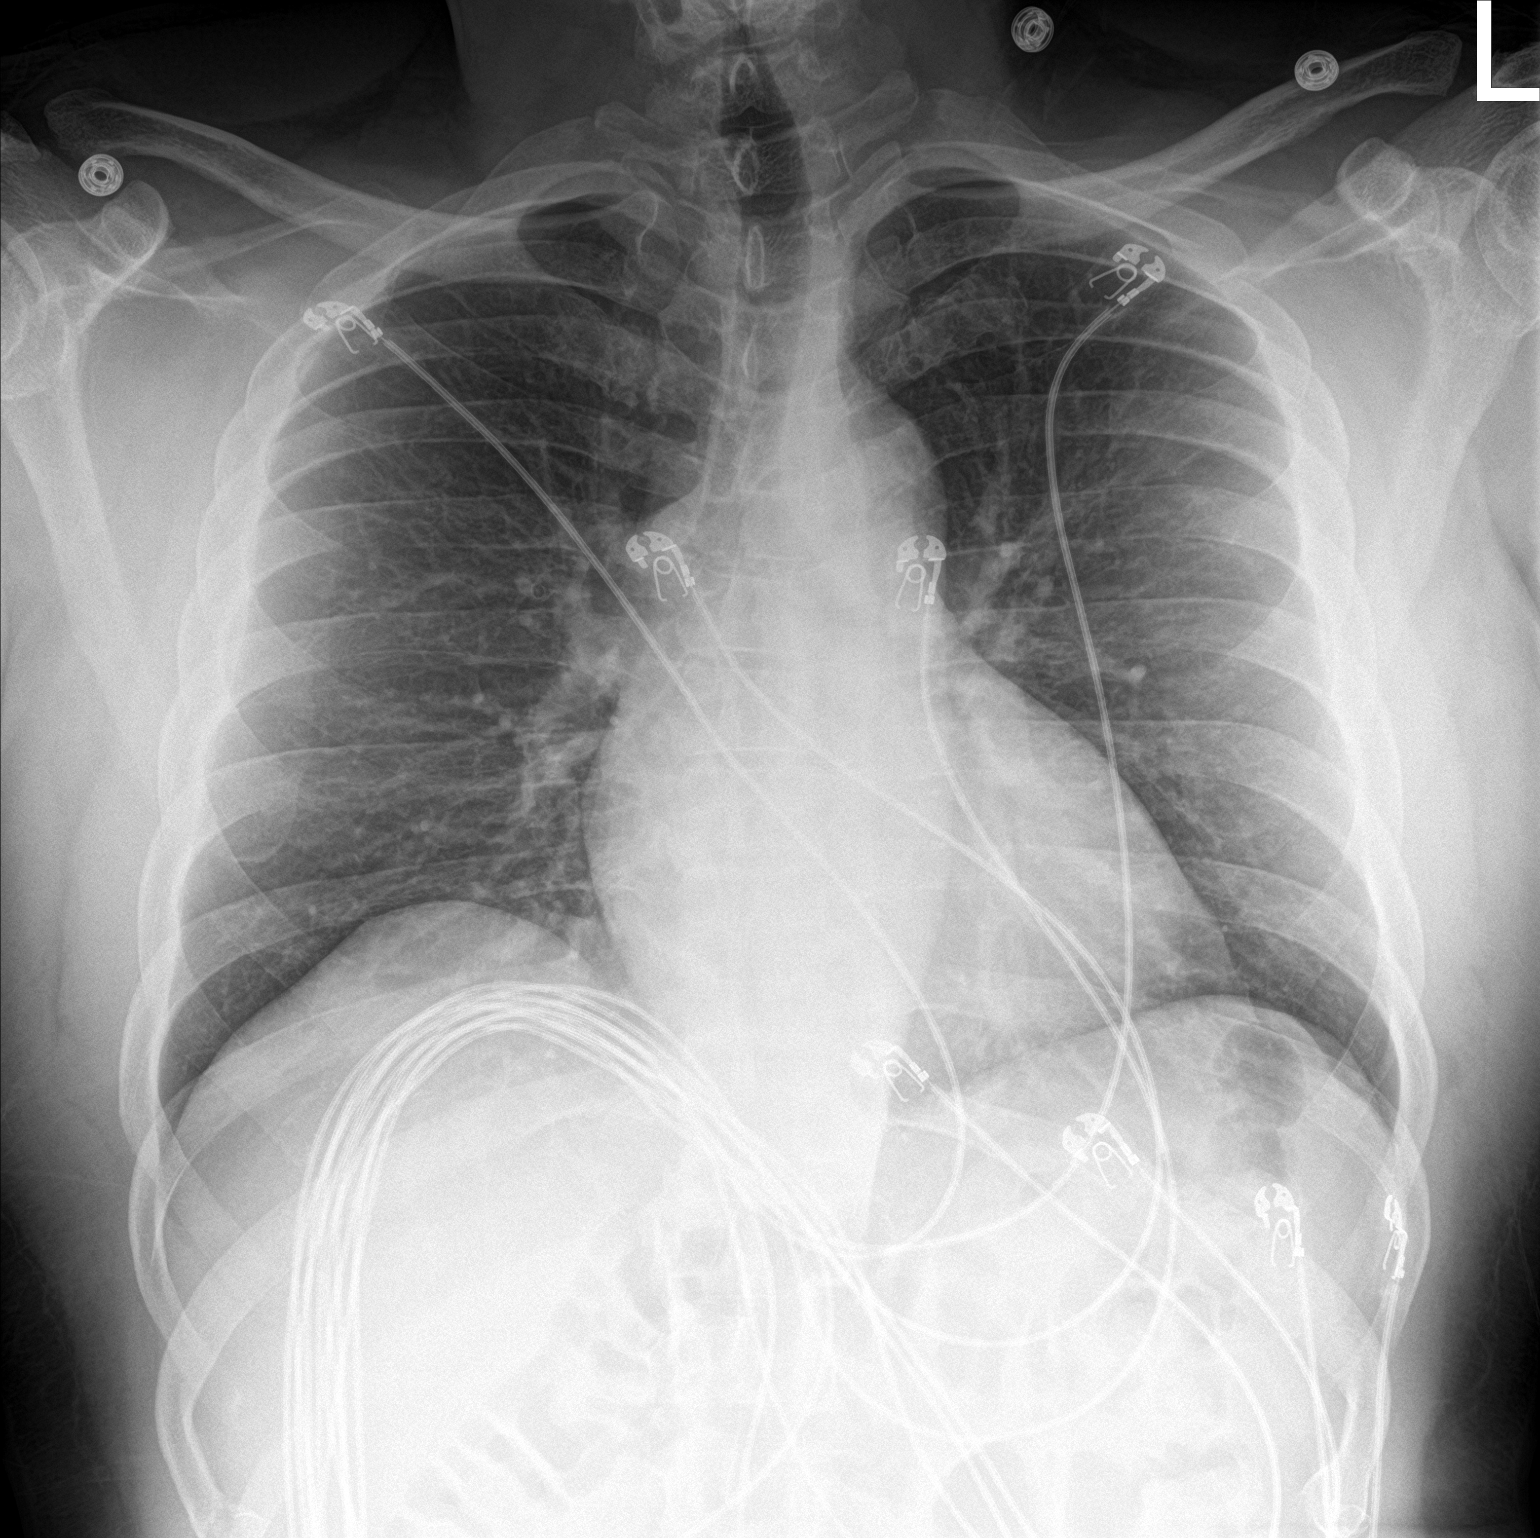

[chest lat]
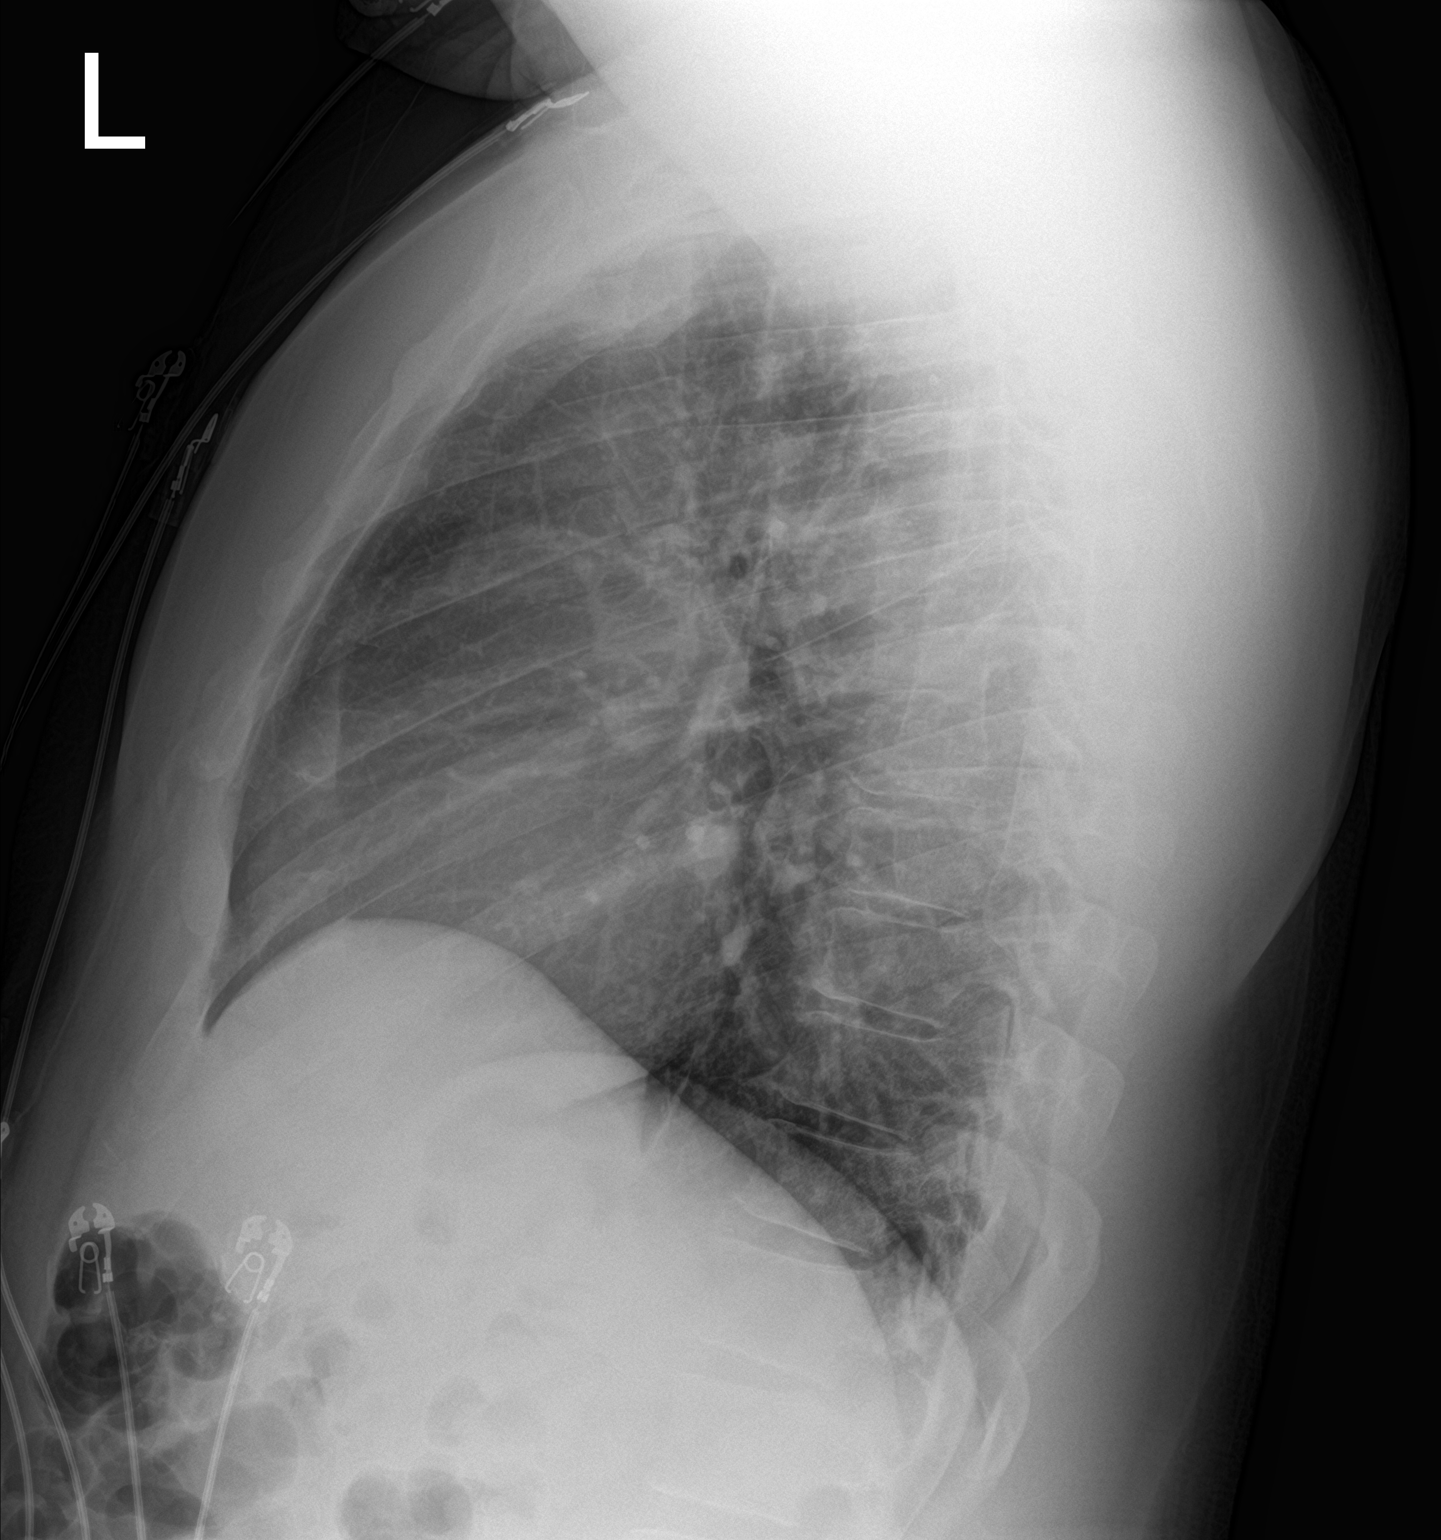

[2 of 2 positions shown; findings below may reference images not displayed]

FINDINGS: The heart size and mediastinal contours are within normal limits.
Both lungs are clear. The visualized skeletal structures are
unremarkable.
IMPRESSION: No active cardiopulmonary disease.

## 2021-08-02 IMAGING — MR MR CERVICAL SPINE W/O CM
5 series · 35 of 48 positions shown · non-contrast
Comparison: Cervical spine CT [SQ] hours today.

CLINICAL DATA: 45-year-old male with sudden onset dizziness, left
arm numbness. Neurologic deficit. Pain and paresthesia. Cervical
radiculopathy.

EXAM:
MRI CERVICAL SPINE WITHOUT CONTRAST
TECHNIQUE: Multiplanar, multisequence MR imaging of the cervical spine was
performed. No intravenous contrast was administered.

[Series 22: T2 · sagittal · 3.0mm · 0.69mm/px · 5 of 15 slices shown (1 of 2)]
[im 1/15]
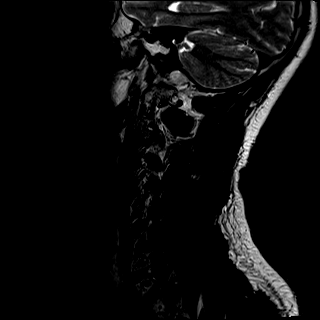
[im 4/15]
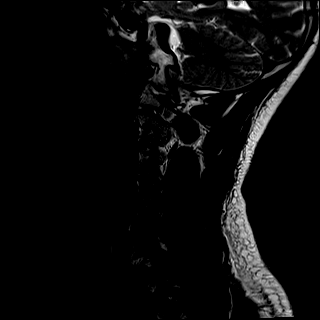
[im 8/15]
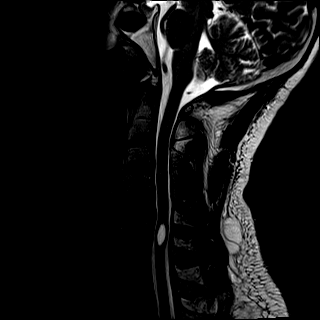
[im 11/15]
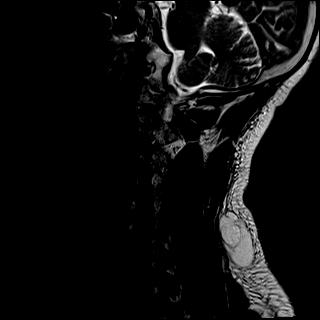
[im 15/15]
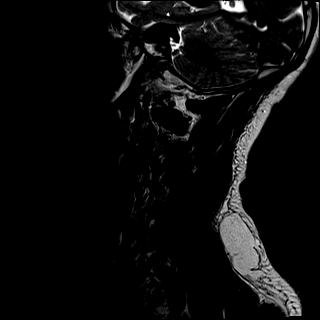

[Series 23: T1 · sagittal · 3.0mm · 0.69mm/px · 5 of 15 slices shown]
[im 1/15]
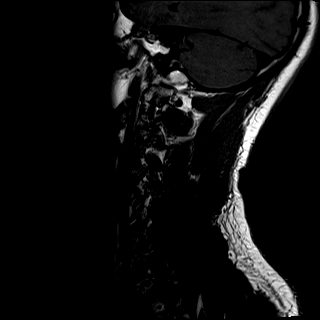
[im 4/15]
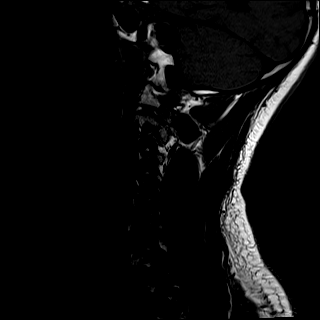
[im 8/15]
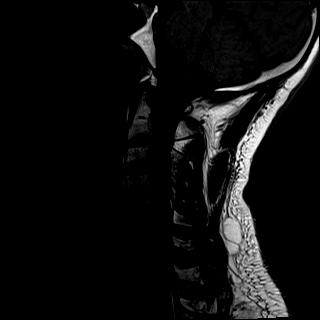
[im 11/15]
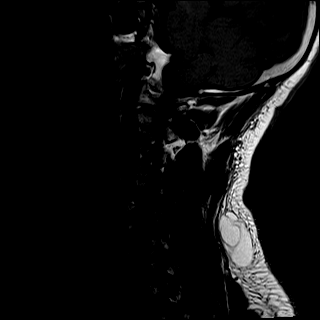
[im 15/15]
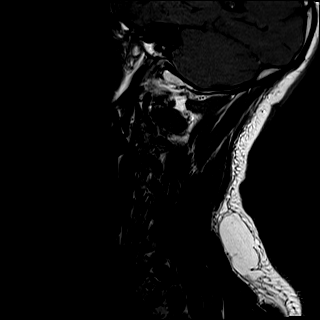

[Series 24: STIR · sagittal · 3.0mm · 0.86mm/px · 6 of 15 slices shown]
[im 1/15]
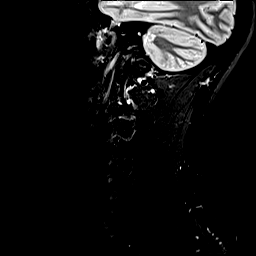
[im 3/15]
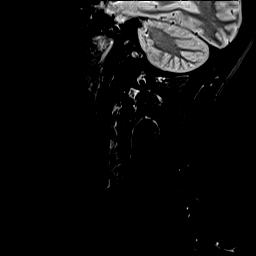
[im 6/15]
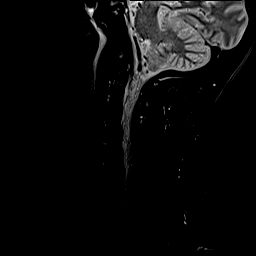
[im 9/15]
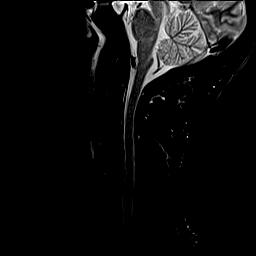
[im 12/15]
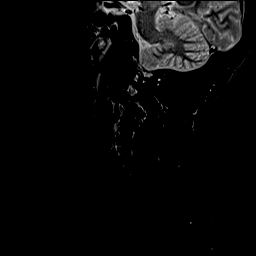
[im 15/15]
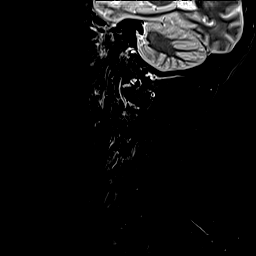

[Series 25: T2 · axial · 3.0mm · 0.66mm/px · z∈[-265,-137]mm · 11 of 42 slices shown (2 of 2)]
[im 1/42]
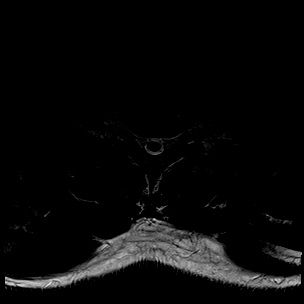
[im 3/42]
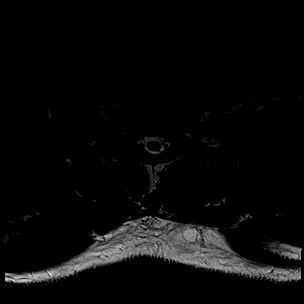
[im 6/42]
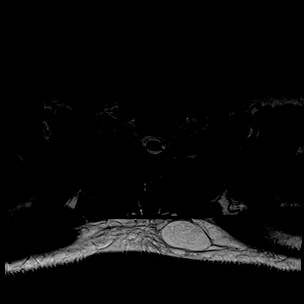
[im 9/42]
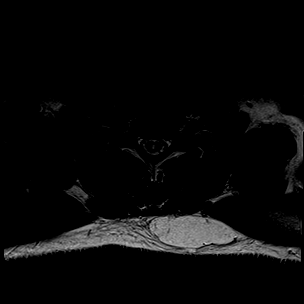
[im 14/42]
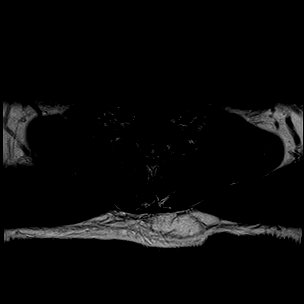
[im 20/42]
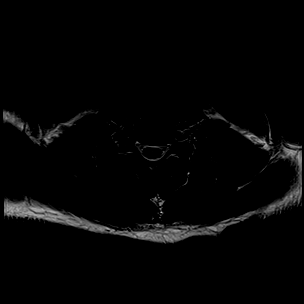
[im 22/42]
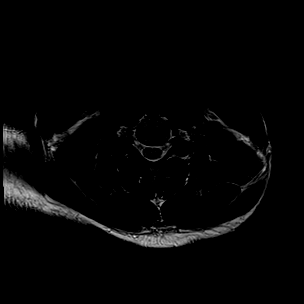
[im 25/42]
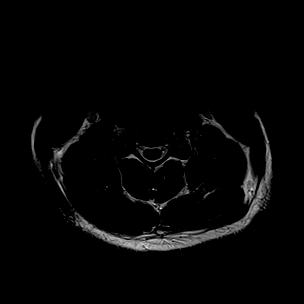
[im 31/42]
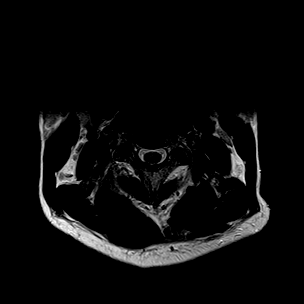
[im 36/42]
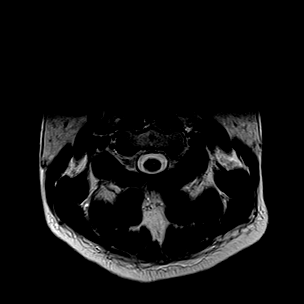
[im 42/42]
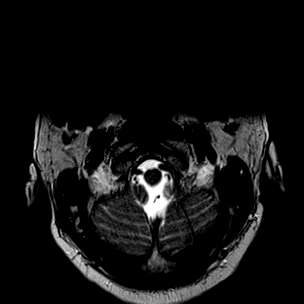

[Series 26: GRE · axial · 3.0mm · 0.39mm/px · z∈[-259,-137]mm · 8 of 42 slices shown]
[im 3/42]
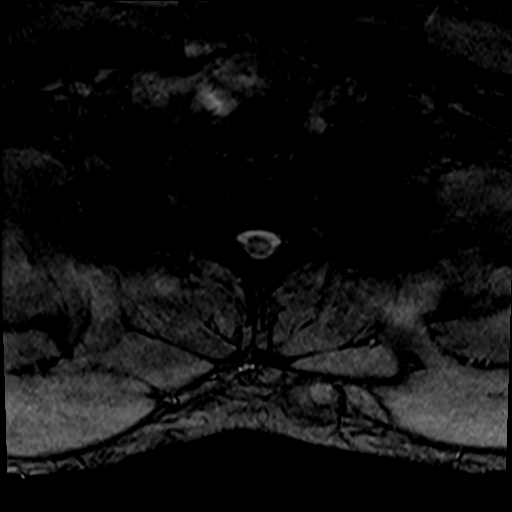
[im 9/42]
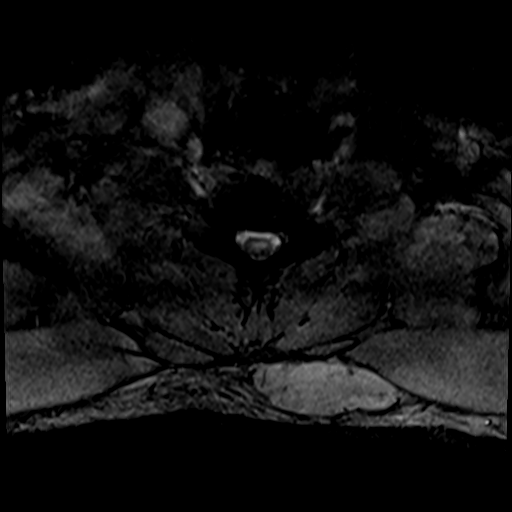
[im 14/42]
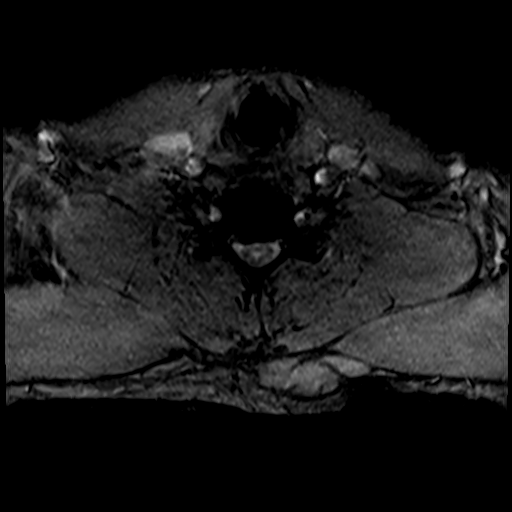
[im 20/42]
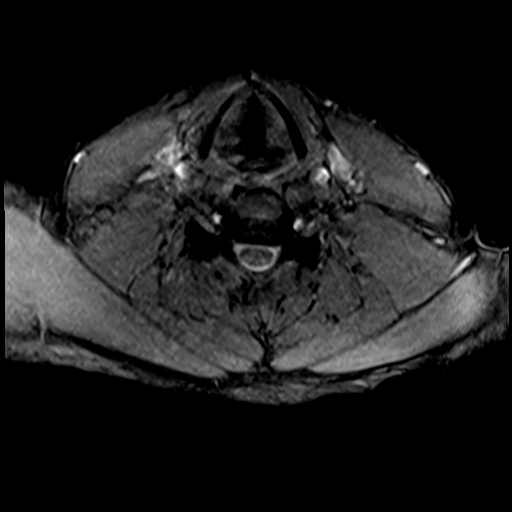
[im 25/42]
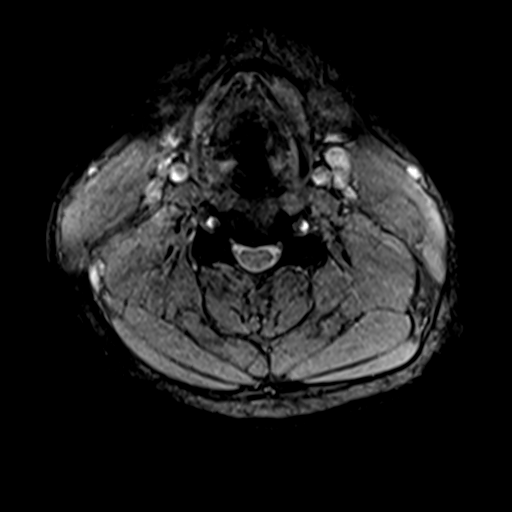
[im 31/42]
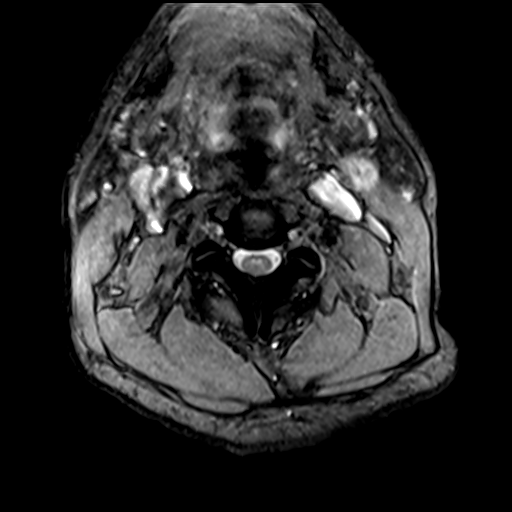
[im 36/42]
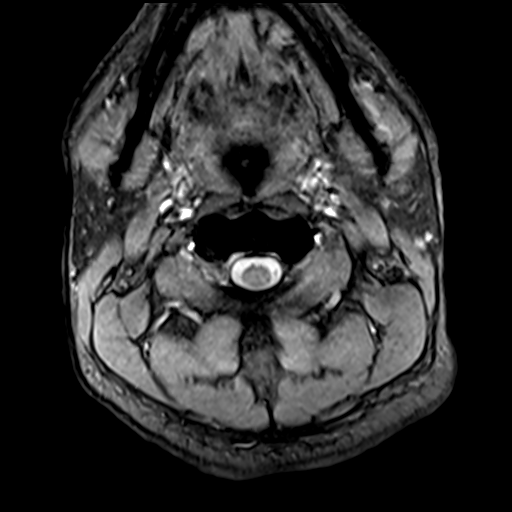
[im 42/42]
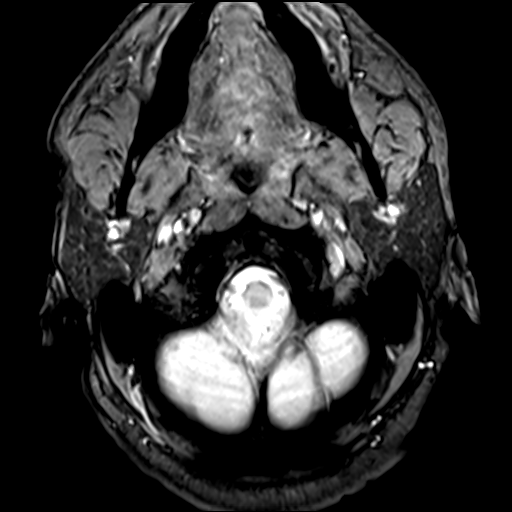

[35 of 48 positions shown; findings below may reference images not displayed]

FINDINGS: Alignment: Straightening of cervical lordosis. No significant
spondylolisthesis.

Vertebrae: Degenerative endplate signal changes at C5-C6 with No
marrow edema or evidence of acute osseous abnormality. Background
bone marrow signal is within normal limits.

Cord: Abnormal lower cervical spinal cord. Centered at the lower C6
level, there is a circumscribed, oval 5-6 mm diameter by roughly 15
mm long segment of central increased T2 and decreased T1 cord signal
most suggestive of syrinx.

Mild associated cord expansion. But no associated cord edema. No
evidence of hemosiderin on T2* imaging.

Above and below that level spinal cord signal and morphology are
within normal limits. And the spinal canal is normally patent at
most levels.

Posterior Fossa, vertebral arteries, paraspinal tissues:
Cervicomedullary junction is within normal limits. Brain is detailed
separately today. Preserved major vascular flow voids in the neck.
Negative visible neck soft tissues aside from a left posterior
subcutaneous lipoma, about 2.3 x 6.3 x 4.2 cm (AP by transverse by
CC)

Disc levels:

C2-C3:  Negative.

C3-C4:  Subtle disc bulging. No significant stenosis.

C4-C5: Similar subtle disc bulging and endplate spurring. No
significant stenosis.

C5-C6: Disc space loss with circumferential disc osteophyte complex,
mostly anterior and lateral. Broad-based posterior component results
in borderline to mild spinal stenosis. Mild if any cord mass effect.
Mild bilateral C6 foraminal stenosis, up to moderate on the right.

C6-C7: Minor disc bulging and endplate spurring. No significant
stenosis.

C7-T1:  Mild facet hypertrophy. No stenosis.

T1-T2: Negative.
IMPRESSION: 1. Abnormal lower cervical spinal cord, most suggestive of a short
segment Syrinx centered at the lower C6 level. But recommend
follow-up post-contrast Cervical MRI to evaluate for enhancement.
Neurosurgery consultation also recommended.

2. Generally mild cervical spine degeneration, although disc and
endplate degeneration is most pronounced at C5-C6 - just above the
cord lesion. Borderline to mild spinal stenosis there with mild if
any cord mass effect. Up to moderate right greater than left C6
foraminal stenosis.

## 2021-08-02 IMAGING — MR MR HEAD W/O CM
12 of 13 series · 44 of 48 positions shown · non-contrast
Comparison: CT head and cervical spine earlier today.

CLINICAL DATA: 45-year-old male with sudden onset dizziness, left
arm numbness. Neurologic deficit. Pain and paresthesia.

EXAM:
MRI HEAD WITHOUT CONTRAST
TECHNIQUE: Multiplanar, multiecho pulse sequences of the brain and surrounding
structures were obtained without intravenous contrast.

[Series 5: DWI · axial · 3.0mm · 0.88mm/px · z∈[-83,+68]mm · 8 of 104 slices shown (1 of 4)]
[im 1/104]
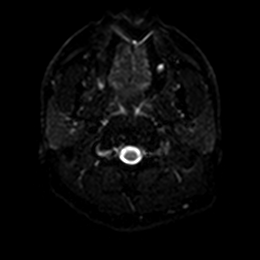
[im 15/104]
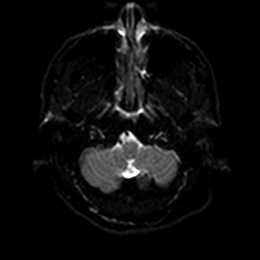
[im 30/104]
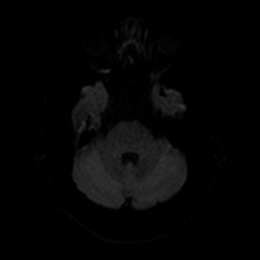
[im 45/104]
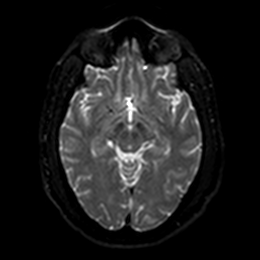
[im 59/104]
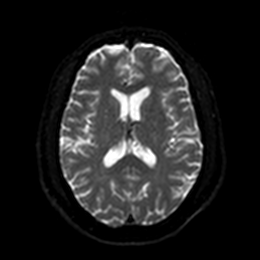
[im 74/104]
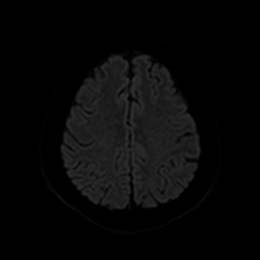
[im 89/104]
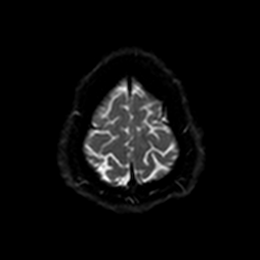
[im 104/104]
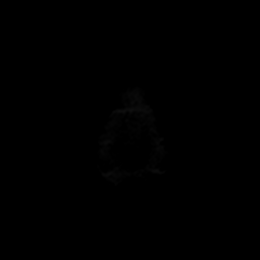

[Series 6: DWI · axial · 3.0mm · 0.88mm/px · z∈[-83,+68]mm · 4 of 52 slices shown (2 of 4)]
[im 1/52]
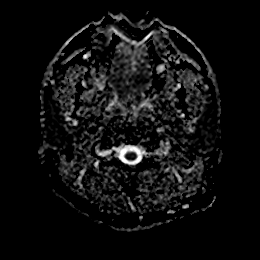
[im 18/52]
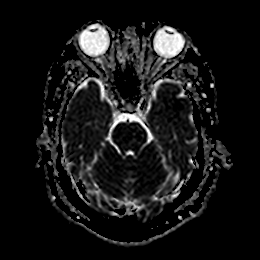
[im 35/52]
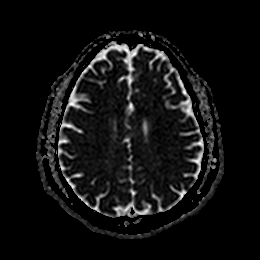
[im 52/52]
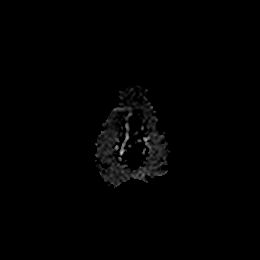

[Series 7: DWI · coronal · 4.0mm · 0.88mm/px · 6 of 76 slices shown (3 of 4)]
[im 1/76]
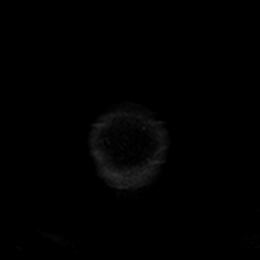
[im 16/76]
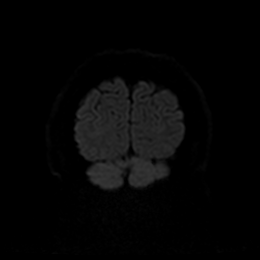
[im 31/76]
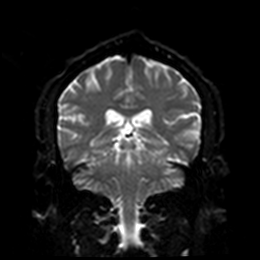
[im 46/76]
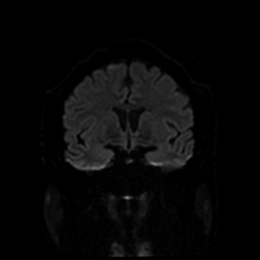
[im 61/76]
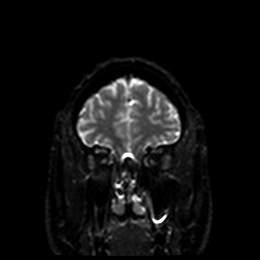
[im 76/76]
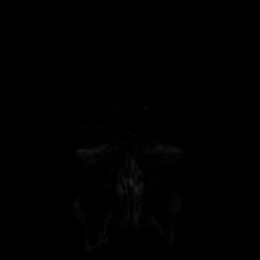

[Series 8: DWI · coronal · 4.0mm · 0.88mm/px · 3 of 38 slices shown (4 of 4)]
[im 1/38]
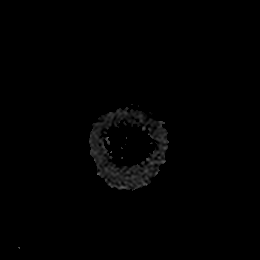
[im 19/38]
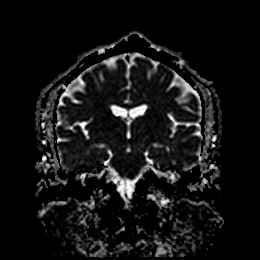
[im 38/38]
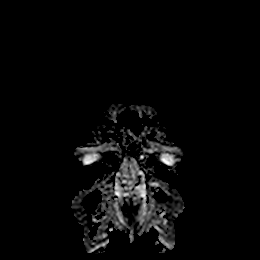

[Series 9: T1 · sagittal · 5.0mm · 0.75mm/px · 2 of 25 slices shown]
[im 1/25]
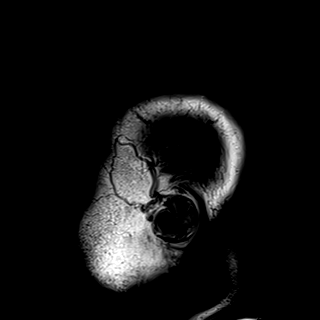
[im 25/25]
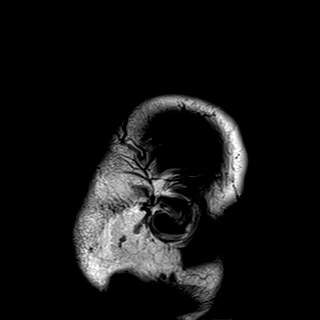

[Series 10: T2 · axial · 5.0mm · 0.72mm/px · z∈[-81,+67]mm · 2 of 26 slices shown (1 of 2)]
[im 1/26]
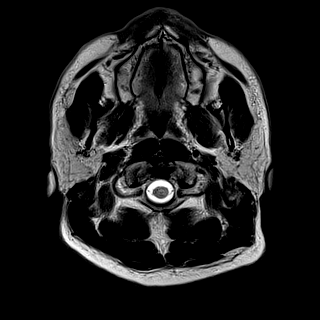
[im 26/26]
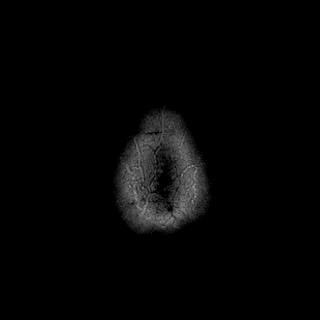

[Series 11: FLAIR · axial · 5.0mm · 0.45mm/px · z∈[-82,+66]mm · 2 of 26 slices shown]
[im 1/26]
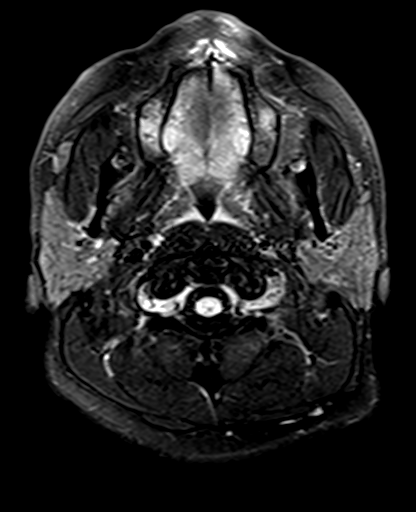
[im 26/26]
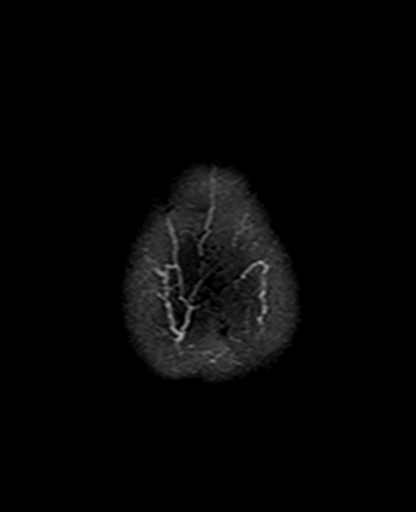

[Series 12: mag_images · axial · 3.0mm · 0.90mm/px · z∈[-84,+67]mm · 4 of 52 slices shown]
[im 1/52]
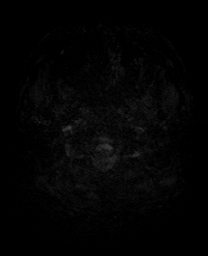
[im 18/52]
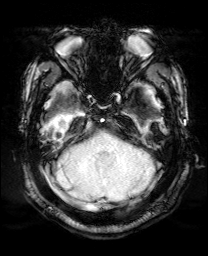
[im 35/52]
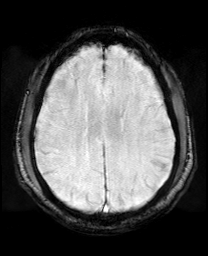
[im 52/52]
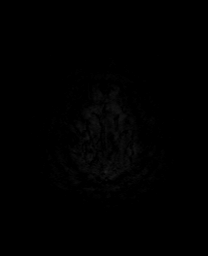

[Series 13: pha_images · axial · 3.0mm · 0.90mm/px · z∈[-84,+67]mm · 4 of 52 slices shown]
[im 1/52]
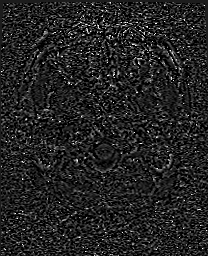
[im 18/52]
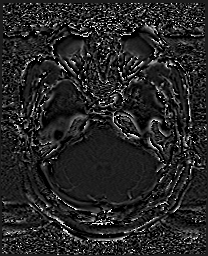
[im 35/52]
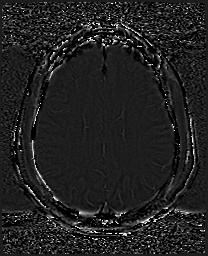
[im 52/52]
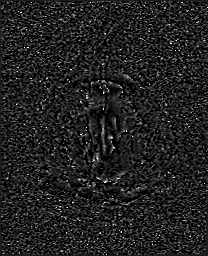

[Series 14: swi_images · axial · 3.0mm · 0.90mm/px · z∈[-84,+67]mm · 4 of 52 slices shown]
[im 1/52]
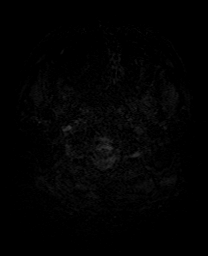
[im 18/52]
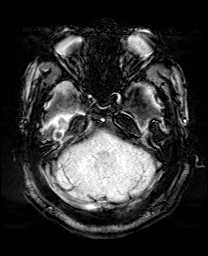
[im 35/52]
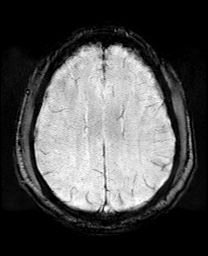
[im 52/52]
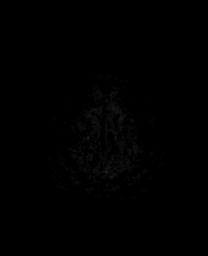

[Series 15: mip_images(sw) · axial · 24.0mm · 0.90mm/px · z∈[-73,+57]mm · 3 of 45 slices shown]
[im 1/45]
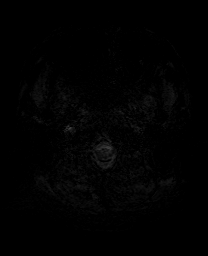
[im 23/45]
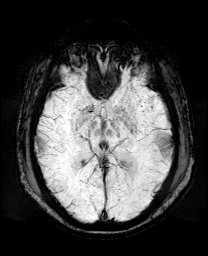
[im 45/45]
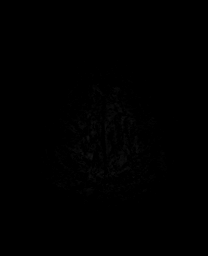

[Series 17: T2 · coronal · 5.0mm · 0.34mm/px · 2 of 31 slices shown (2 of 2)]
[im 1/31]
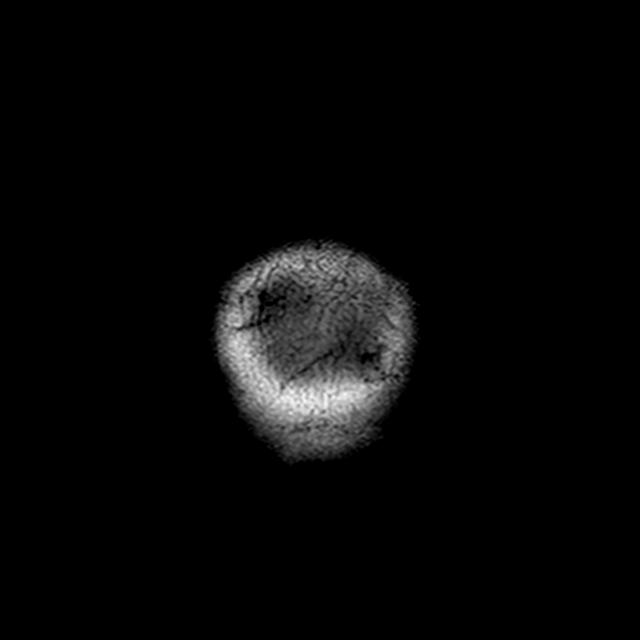
[im 31/31]
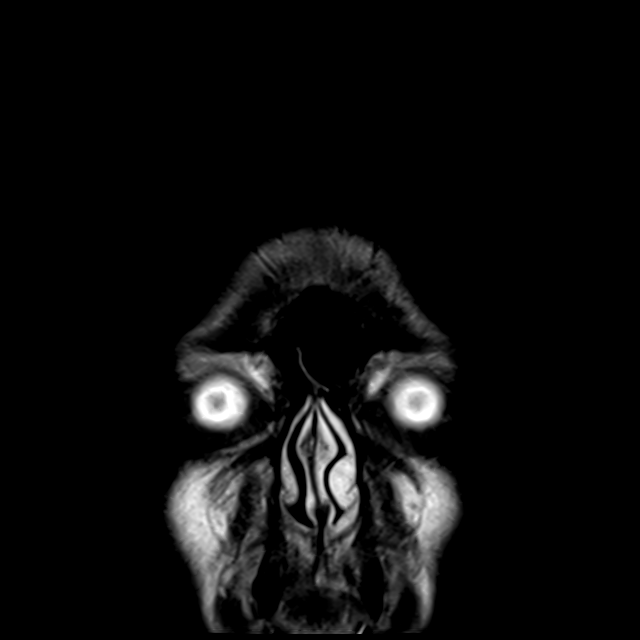

[44 of 48 positions shown; findings below may reference images not displayed]

FINDINGS: Brain: Normal cerebral volume. No restricted diffusion to suggest
acute infarction. No midline shift, mass effect, evidence of mass
lesion, ventriculomegaly, extra-axial collection or acute
intracranial hemorrhage. Cervicomedullary junction and pituitary are
within normal limits.

Gray and white matter signal is within normal limits throughout the
brain. No encephalomalacia or chronic cerebral blood products
identified.

Vascular: Major intracranial vascular flow voids are preserved.

Skull and upper cervical spine: Cervical spine is detailed
separately today. Visualized bone marrow signal is within normal
limits.

Sinuses/Orbits: Negative orbits. Minimal paranasal sinus mucosal
thickening, most pronounced in the left maxillary alveolar recess.

Other: Mastoids are clear. Visible internal auditory structures
appear normal. Normal stylomastoid foramina. Negative visible scalp
and face.
IMPRESSION: Normal noncontrast MRI appearance of the brain.

## 2021-08-02 IMAGING — CT CT HEAD W/O CM
4 series · 17 of 47 positions shown, 19 images · non-contrast
Comparison: None Available.

CLINICAL DATA: Sudden onset of dizziness and left arm numbness.



[Series 3: head wo · axial · 0.49mm/px · z∈[+1328,+1448]mm · 7 of 34 slices shown, 9 images]
[im 5/34  brain]
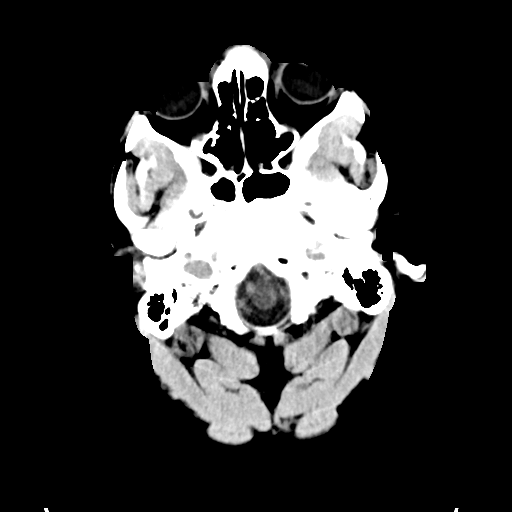
[im 5/34  bone]
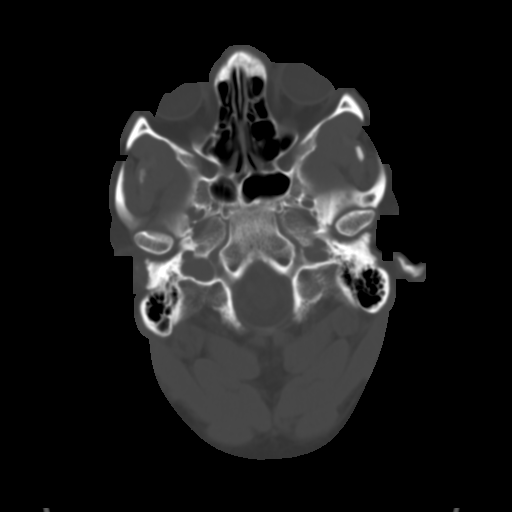
[im 9/34  brain]
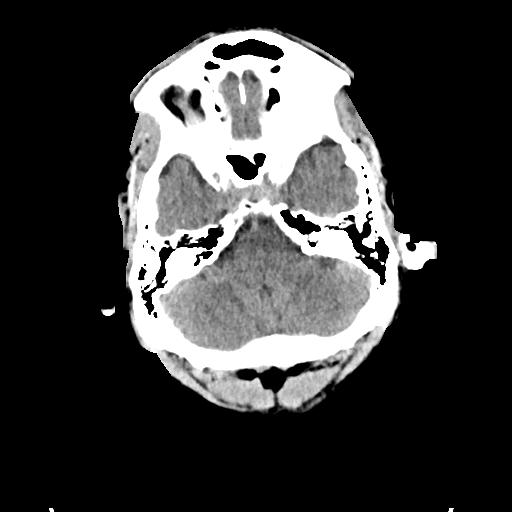
[im 13/34  brain]
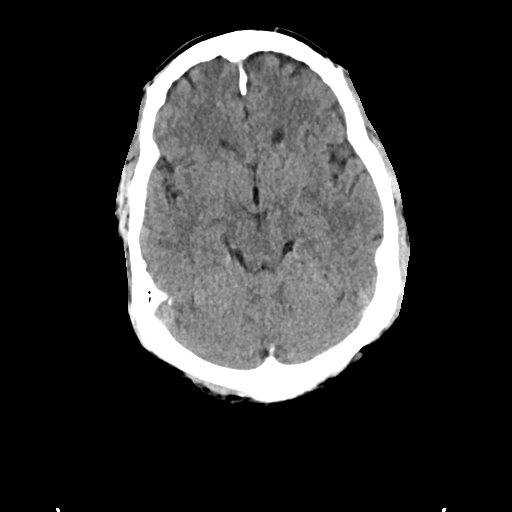
[im 17/34  brain]
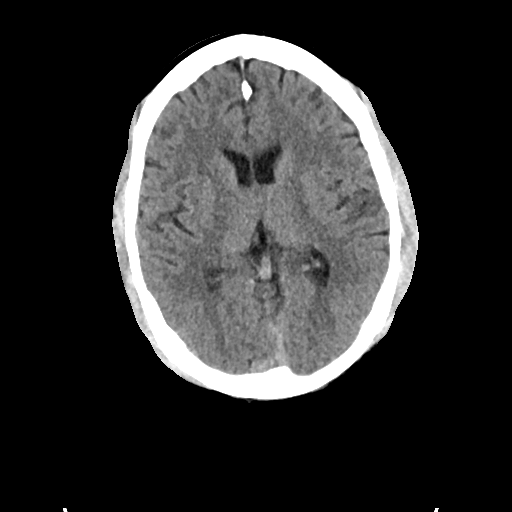
[im 21/34  brain]
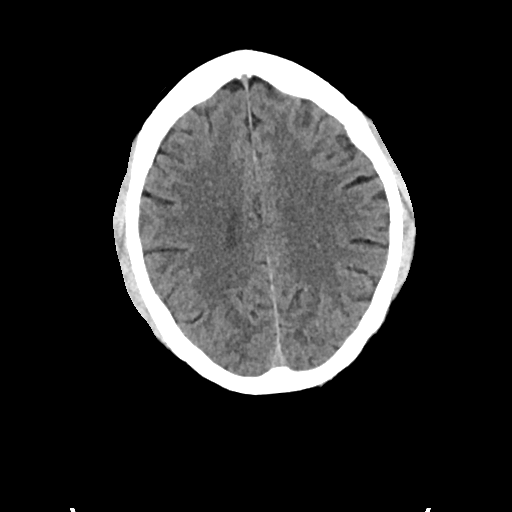
[im 21/34  bone]
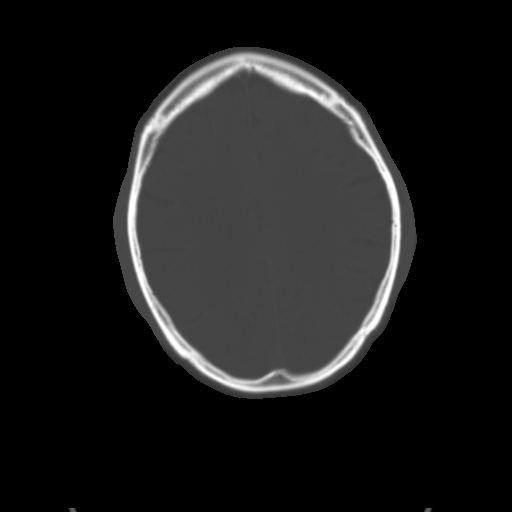
[im 25/34  brain]
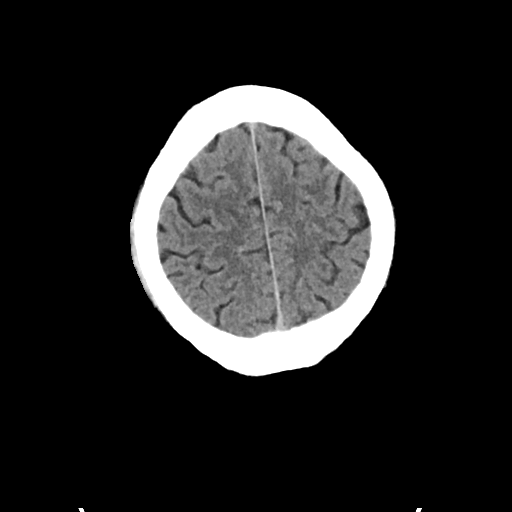
[im 29/34  brain]
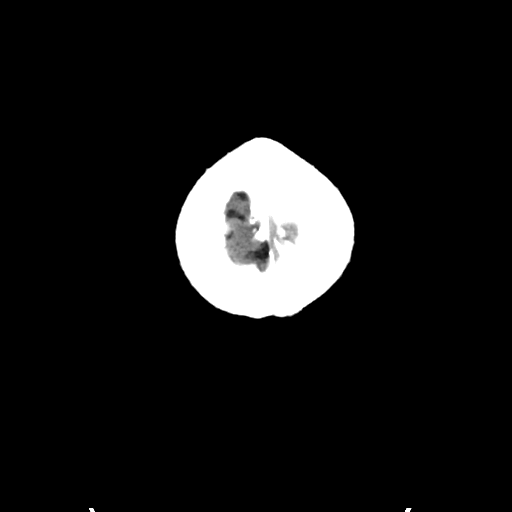

[Series 4: head bone · axial · 0.49mm/px · z∈[+1324,+1382]mm · 4 of 85 slices shown]
[im 9/85  bone]
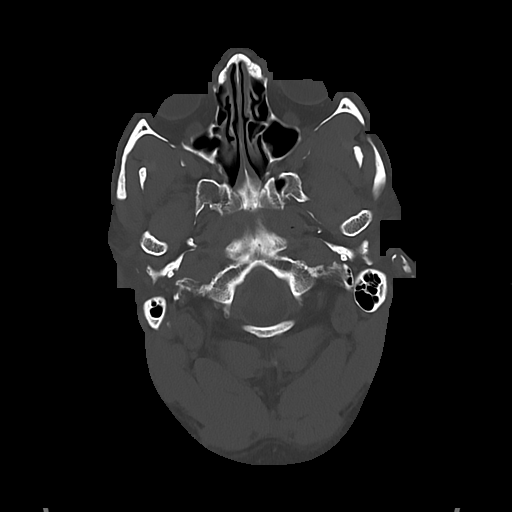
[im 17/85  bone]
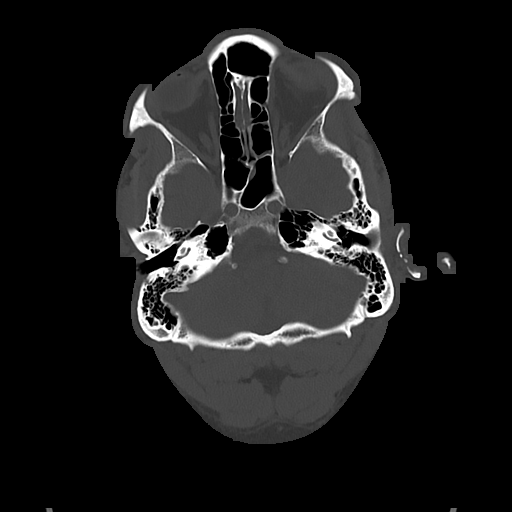
[im 26/85  bone]
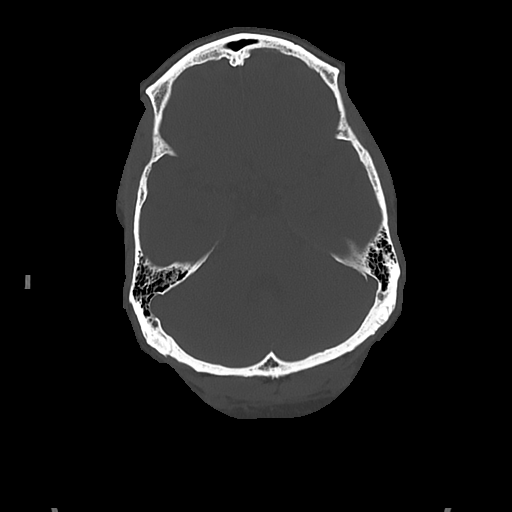
[im 38/85  bone]
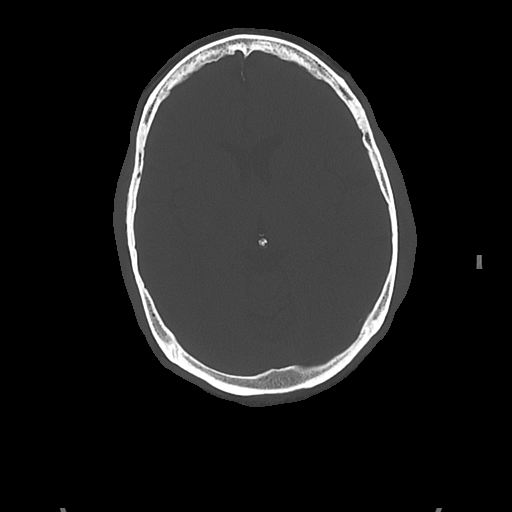

[Series 5: cor soft · coronal · 0.35mm/px · 3 of 73 slices shown]
[im 25/73  brain]
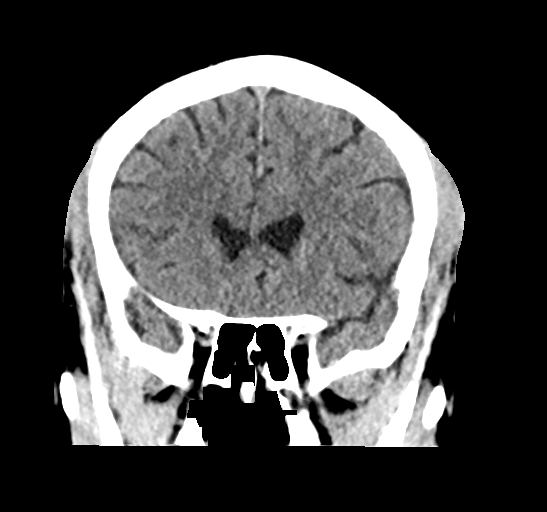
[im 33/73  brain]
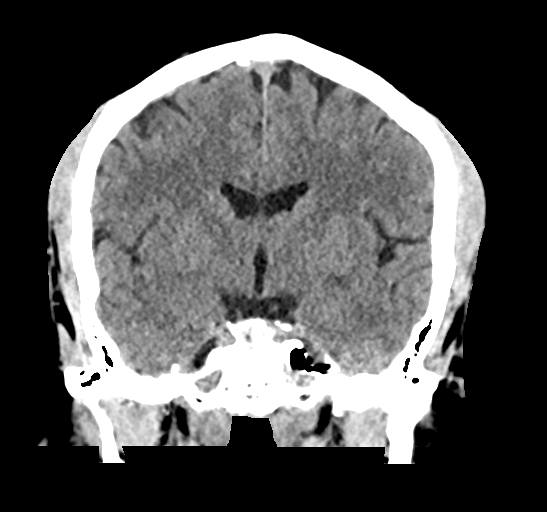
[im 41/73  brain]
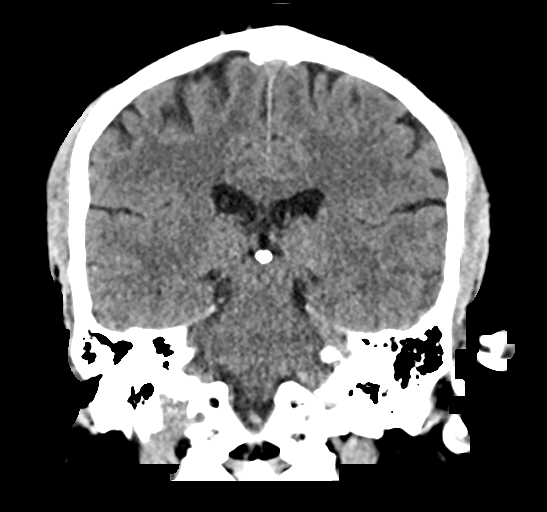

[Series 6: sag soft · sagittal · 0.37mm/px · 3 of 64 slices shown]
[im 22/64  brain]
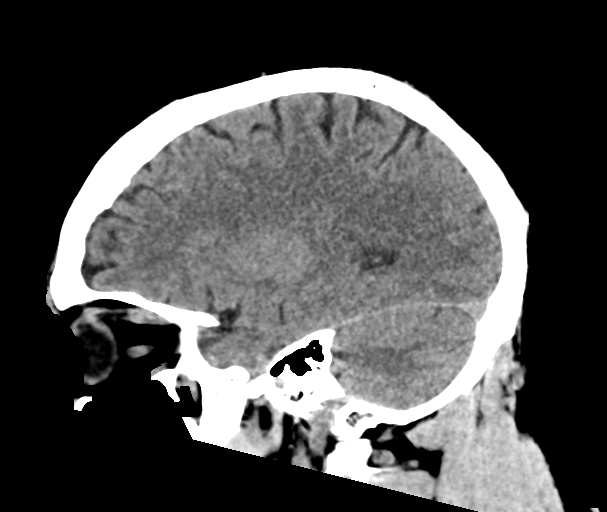
[im 32/64  brain]
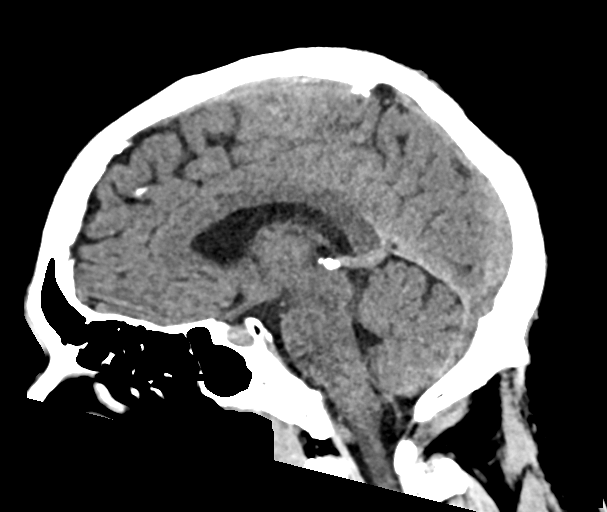
[im 43/64  brain]
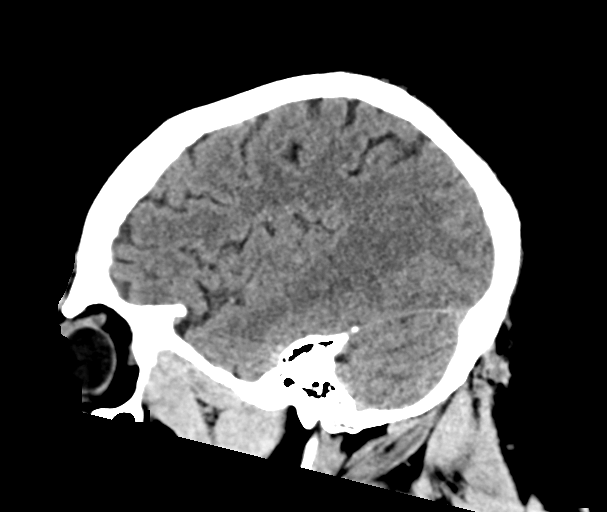

[17 of 47 positions shown; findings below may reference images not displayed]

FINDINGS: Brain: No evidence of acute infarction, hemorrhage, hydrocephalus,
extra-axial collection or mass lesion/mass effect.

Vascular: No hyperdense vessel or unexpected calcification.

Skull: Normal. Negative for fracture or focal lesion.

Sinuses/Orbits: No acute finding.

Other: None.
IMPRESSION: No acute intracranial pathology.

## 2021-08-02 IMAGING — MR MR CERVICAL SPINE WO/W CM
7 of 8 series · 31 of 48 positions shown · IV contrast (Gadavist)
Comparison: Earlier same day

CLINICAL DATA: Follow-up abnormal cervical MRI. Chest pain and
paresthesia.

EXAM:
MRI CERVICAL SPINE WITHOUT AND WITH CONTRAST
TECHNIQUE: Multiplanar and multiecho pulse sequences of the cervical spine, to
include the craniocervical junction and cervicothoracic junction,
were obtained without and with intravenous contrast.
CONTRAST:  10mL GADAVIST GADOBUTROL 1 MMOL/ML IV SOLN

[Series 5: T1 · sagittal · 3.0mm · 0.69mm/px · 3 of 15 slices shown (1 of 2)]
[im 1/15]
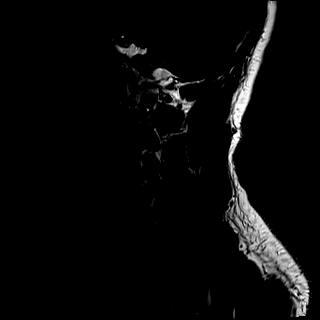
[im 8/15]
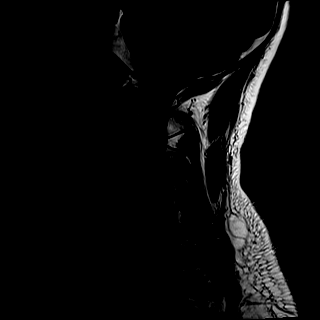
[im 15/15]
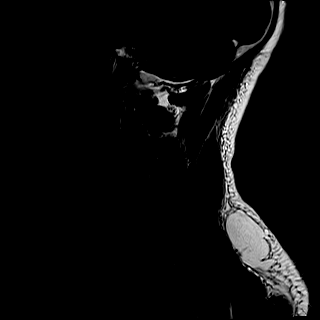

[Series 6: STIR · sagittal · 3.0mm · 0.86mm/px · 3 of 15 slices shown]
[im 1/15]
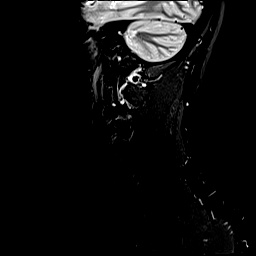
[im 8/15]
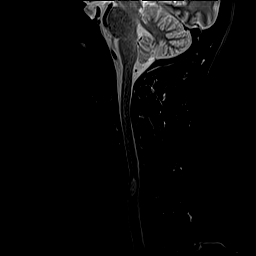
[im 15/15]
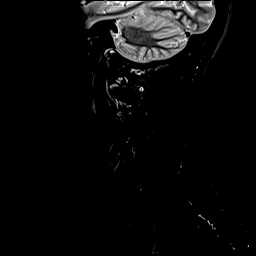

[Series 7: T2 · axial · 3.0mm · 0.66mm/px · z∈[-138,-11]mm · 9 of 42 slices shown (1 of 2)]
[im 1/42]
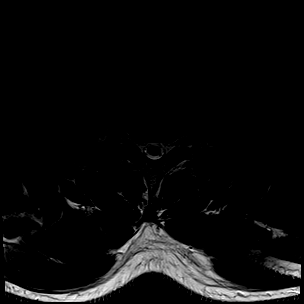
[im 6/42]
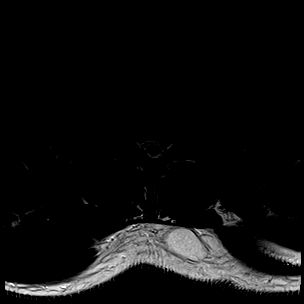
[im 11/42]
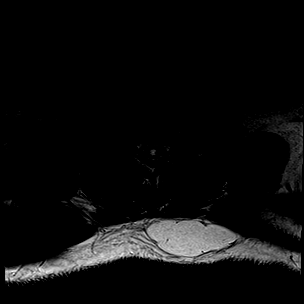
[im 16/42]
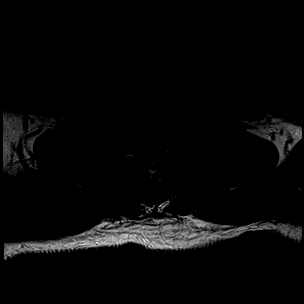
[im 21/42]
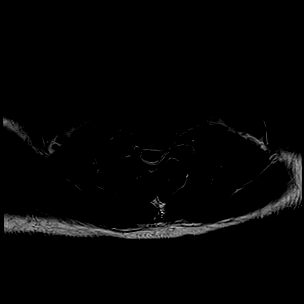
[im 26/42]
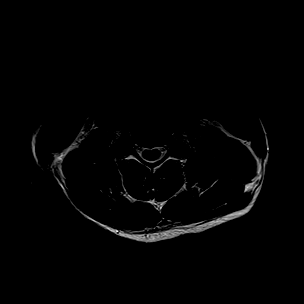
[im 31/42]
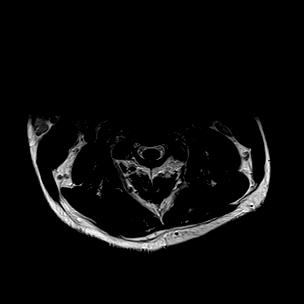
[im 36/42]
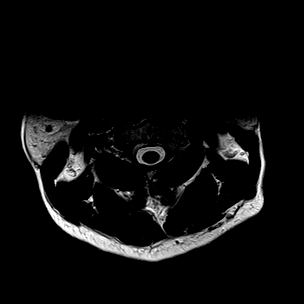
[im 42/42]
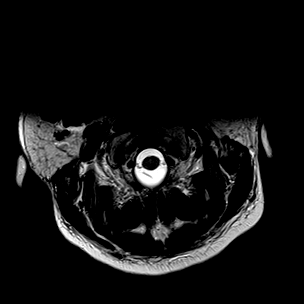

[Series 9: T1 · axial · 3.0mm · 0.39mm/px · z∈[-138,-11]mm · 9 of 42 slices shown (2 of 2)]
[im 1/42]
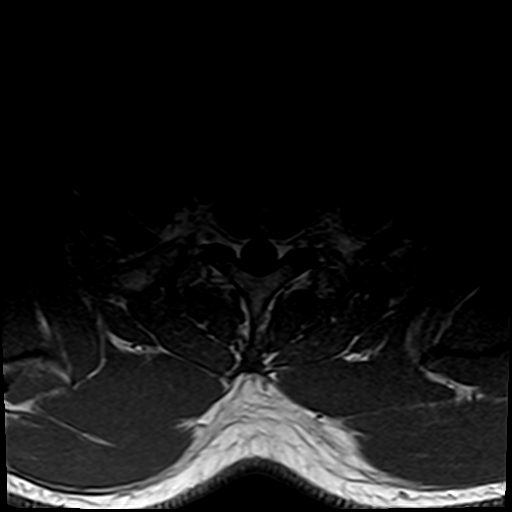
[im 6/42]
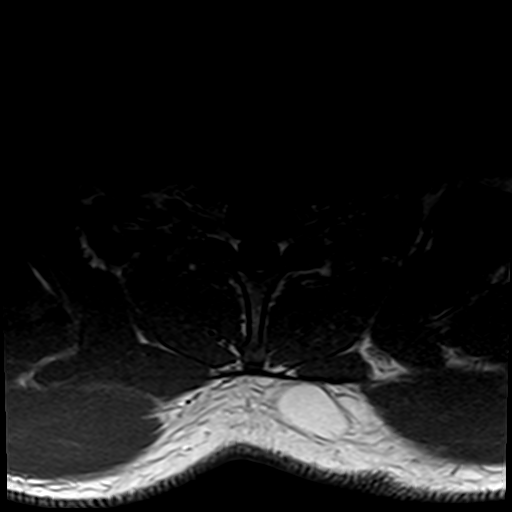
[im 11/42]
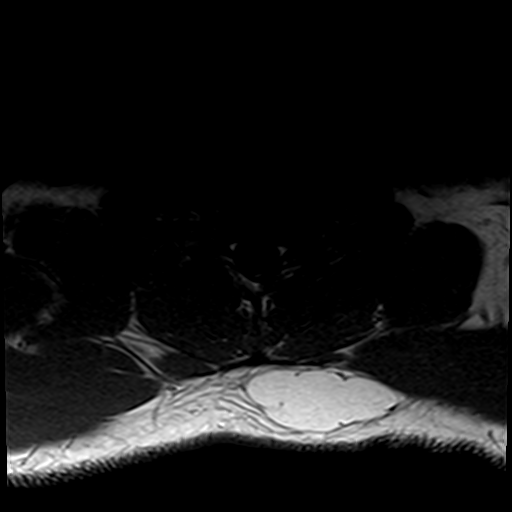
[im 16/42]
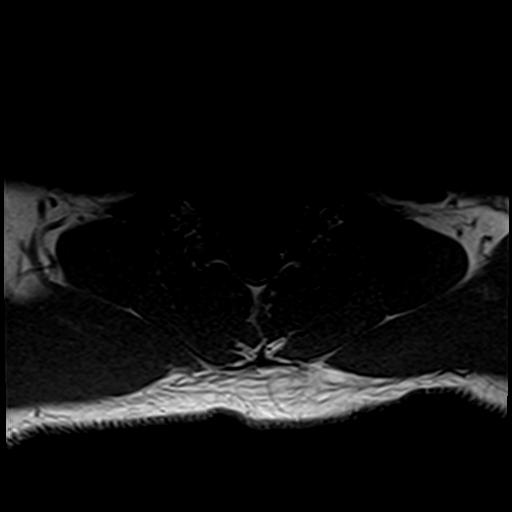
[im 21/42]
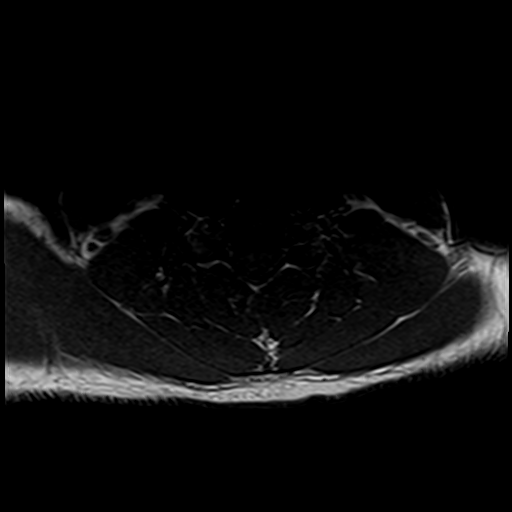
[im 26/42]
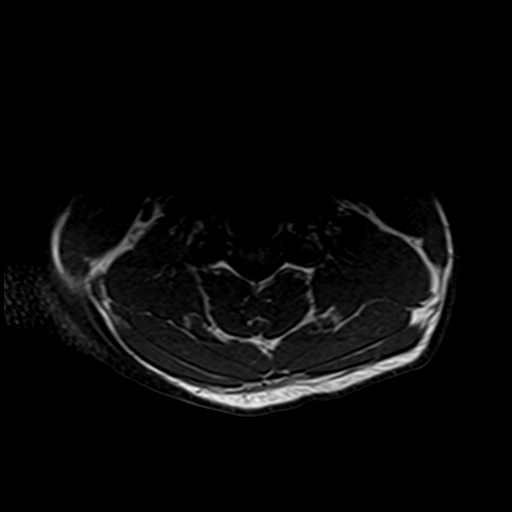
[im 31/42]
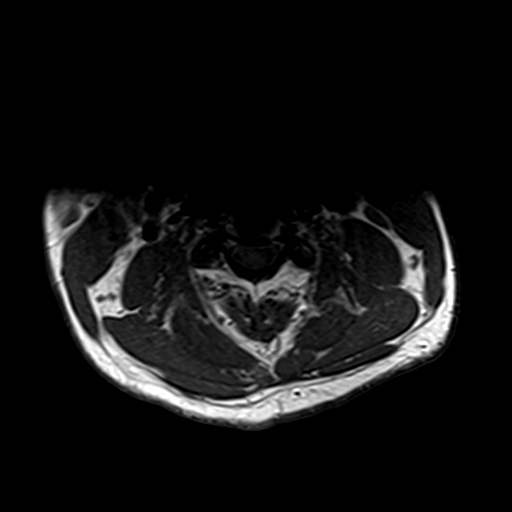
[im 36/42]
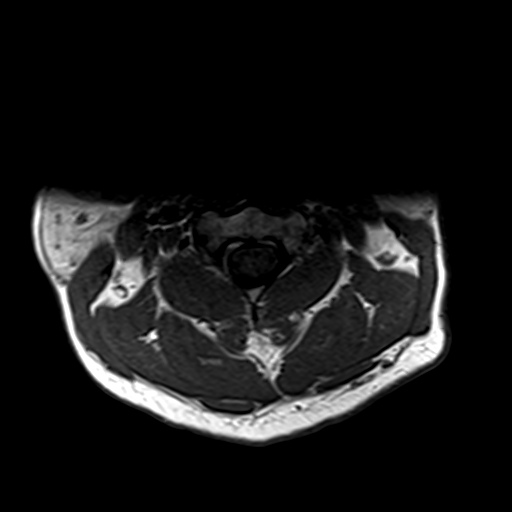
[im 42/42]
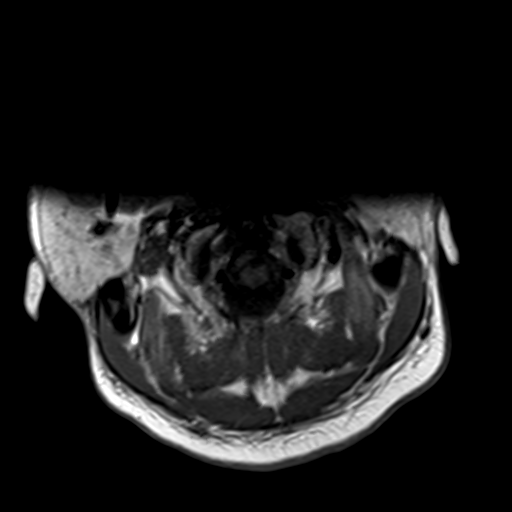

[Series 10: T2 · sagittal · 3.0mm · 0.69mm/px · 3 of 15 slices shown (2 of 2)]
[im 1/15]
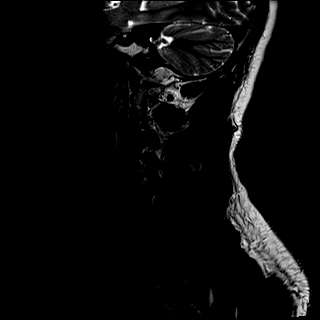
[im 8/15]
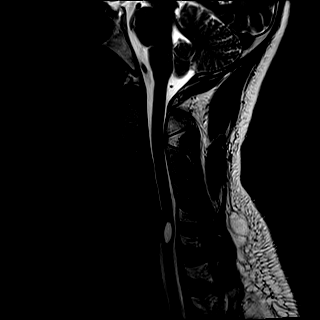
[im 15/15]
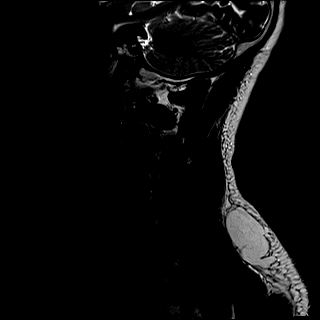

[Series 11: T1 fat-sat post-contrast · sagittal · 3.0mm · 0.43mm/px · 3 of 15 slices shown]
[im 1/15]
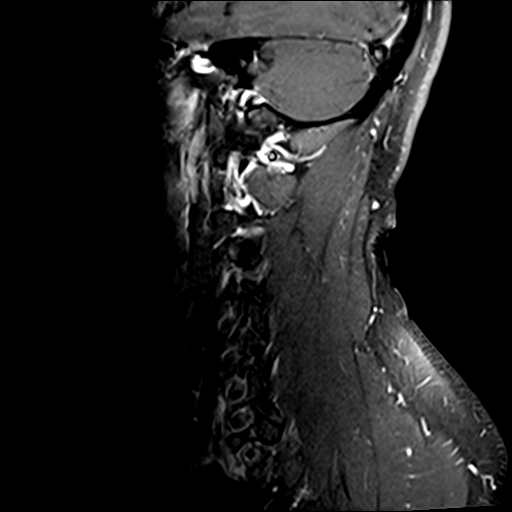
[im 8/15]
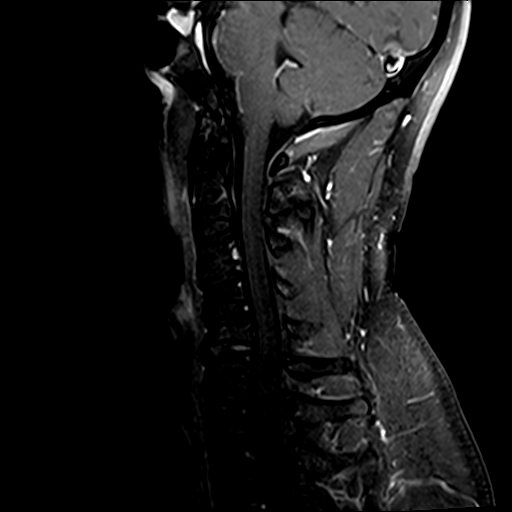
[im 15/15]
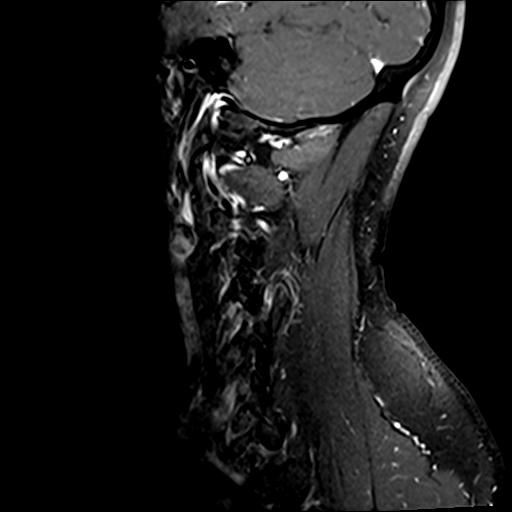

[Series 12: T1 post-contrast · axial · 3.0mm · 0.39mm/px · 1 of 42 slices shown]
[im 1/42]
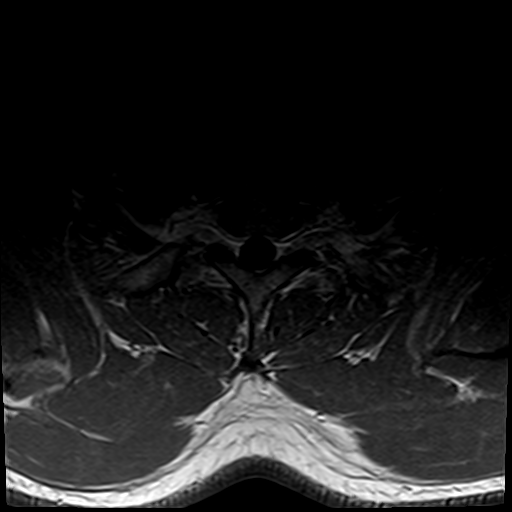

[31 of 48 positions shown; findings below may reference images not displayed]

FINDINGS: Postcontrast imaging does not show any abnormal enhancement
associated with the 15 x 6 x 5 mm syrinx at the C6 level. No finding
to suggest that this is due to neoplastic or inflammatory disease.

No other change in the other cervical region findings described on
the prior complete exam.
IMPRESSION: No abnormal enhancement associated with the localized syrinx at the
C6 level. This could be symptomatic. No finding to suggest that this
is due to neoplastic or inflammatory disease.

## 2021-08-02 MED ORDER — METHYLPREDNISOLONE 4 MG PO TBPK: ORAL_TABLET | ORAL | 0 refills | Status: AC

## 2021-08-02 MED ORDER — DIAZEPAM 5 MG/ML IJ SOLN
2.0000 mg | Freq: Once | INTRAMUSCULAR | Status: AC
  Administered 2021-08-02: 2 mg via INTRAVENOUS
  Filled 2021-08-02: qty 2

## 2021-08-02 MED ORDER — LORAZEPAM 2 MG/ML IJ SOLN
1.0000 mg | Freq: Once | INTRAMUSCULAR | Status: AC
Start: 2021-08-02 — End: 2021-08-02
  Administered 2021-08-02: 1 mg via INTRAVENOUS
  Filled 2021-08-02: qty 1

## 2021-08-02 MED ORDER — GADOBUTROL 1 MMOL/ML IV SOLN
10.0000 mL | Freq: Once | INTRAVENOUS | Status: AC | PRN
  Administered 2021-08-02: 10 mL via INTRAVENOUS

## 2021-08-02 MED ORDER — LORAZEPAM 2 MG/ML IJ SOLN
1.0000 mg | Freq: Once | INTRAMUSCULAR | Status: AC | PRN
  Administered 2021-08-02: 1 mg via INTRAVENOUS
  Filled 2021-08-02: qty 1

## 2021-08-02 NOTE — ED Notes (Signed)
Patient transported to X-ray 

## 2021-08-02 NOTE — ED Provider Notes (Signed)
  Physical Exam  BP 123/77 (BP Location: Right Arm)   Pulse 65   Temp 98 F (36.7 C) (Oral)   Resp 20   SpO2 94%   Physical Exam  Procedures  Procedures  ED Course / MDM   Clinical Course as of 08/02/21 2002  Sat Aug 02, 2021  0517 Troponin I (High Sensitivity) Troponin within normal limits [AH]  0517 CBC [AH]  0517 Basic metabolic panel(!) BMP with mildly elevated blood glucose, minimal hypokalemia of insignificant value [AH]  0517 CT Cervical Spine Wo Contrast [AH]  0517 CT Head Wo Contrast I visualized and interpreted CT head and C-spine, no acute findings [AH]  0517 DG Chest 2 View I visualized and interpreted 2 view chest x-ray shows no acute findings [AH]  0517 EKG 12-Lead Unchanged from previous tracing [AH]  1145 Consulted neurosurgery.  The recommendation is to get MR C-spine with contrast.  Plan is to follow-up outpatient.  Recommended to discharge with Medrol Dosepak. [CR]    Clinical Course User Index [AH] Arthor Captain, PA-C [CR] Peter Garter, PA   Medical Decision Making Amount and/or Complexity of Data Reviewed Labs: ordered. Decision-making details documented in ED Course. Radiology: ordered. Decision-making details documented in ED Course. ECG/medicine tests:  Decision-making details documented in ED Course.  Risk Prescription drug management.   46 year old male who initially presented to the ED for chest pain and paresthesias, namely numbness down his left arm.  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  Neurosurgery was consulted and they have recommended obtaining an MRI with contrast of the C-spine for further evaluation.  After obtaining the MRI with contrast, patient can be discharged with outpatient neurosurgery follow-up and a Medrol Dosepak.  MRI with contrast of the C-spine was obtained.  I discussed the results with the patient as well as a plan to follow-up  with neurosurgery as an outpatient and to start the Medrol Dosepak.  Patient verbalizes understanding.  Strict return precautions were discussed and the patient was discharged home in stable condition.       Laurence Compton, MD 08/02/21 Gweneth Fritter    Pricilla Loveless, MD 08/03/21 [phone]

## 2021-08-02 NOTE — ED Notes (Signed)
Patient transported to MRI 

## 2021-08-02 NOTE — ED Provider Notes (Addendum)
Physical Exam  BP (!) 149/97   Pulse (!) 40   Temp 97.9 F (36.6 C) (Oral)   Resp 16   SpO2 93%   Physical Exam Vitals and nursing note reviewed.  Constitutional:      General: He is not in acute distress.    Appearance: He is well-developed.  HENT:     Head: Normocephalic and atraumatic.  Eyes:     Conjunctiva/sclera: Conjunctivae normal.  Cardiovascular:     Rate and Rhythm: Normal rate and regular rhythm.     Heart sounds: No murmur heard. Pulmonary:     Effort: Pulmonary effort is normal. No respiratory distress.     Breath sounds: Normal breath sounds.  Abdominal:     Palpations: Abdomen is soft.     Tenderness: There is no abdominal tenderness.  Musculoskeletal:        General: No swelling.     Cervical back: Neck supple.  Skin:    General: Skin is warm and dry.     Capillary Refill: Capillary refill takes less than 2 seconds.  Neurological:     Mental Status: He is alert.     Sensory: Sensory deficit present.     Comments: Patient complaining of numbness from middle of his left bicep down to his hand.  Numbness is slightly relieved with hand on top of head.  Muscle strength 5 out of 5 for upper extremities radial pulses full and intact bilaterally.  DTRs symmetric and normal.  Psychiatric:        Mood and Affect: Mood normal.    Procedures  Procedures  ED Course / MDM   Clinical Course as of 08/02/21 1220  Sat Aug 02, 2021  0517 Troponin I (High Sensitivity) Troponin within normal limits [AH]  0517 CBC [AH]  0000000 Basic metabolic panel(!) BMP with mildly elevated blood glucose, minimal hypokalemia of insignificant value [AH]  0517 CT Cervical Spine Wo Contrast [AH]  0517 CT Head Wo Contrast I visualized and interpreted CT head and C-spine, no acute findings [AH]  0517 DG Chest 2 View I visualized and interpreted 2 view chest x-ray shows no acute findings [AH]  0517 EKG 12-Lead Unchanged from previous tracing [AH]  1145 Consulted neurosurgery.  The  recommendation is to get MR C-spine with contrast.  Plan is to follow-up outpatient.  Recommended to discharge with Medrol Dosepak. [CR]    Clinical Course User Index [AH] Margarita Mail, PA-C [CR] Wilnette Kales, PA   Medical Decision Making Amount and/or Complexity of Data Reviewed Labs: ordered. Decision-making details documented in ED Course. Radiology: ordered. Decision-making details documented in ED Course. ECG/medicine tests:  Decision-making details documented in ED Course.  Risk Prescription drug management.   Patient received from [facility] at handoff. Pending MR brain and C-spain for CVA r/o upon shift change. See prior note for full HPI and PE.  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  Generally mild cervical spine degeneration although disc and endplate degeneration is most pronounced at C5-C6 -just above the cord lesion.  Borderline to mild spinal stenosis there with mild if any cord mass effect.  Up to moderate right greater than left C6 formal stenosis. Consulted neurosurgery who recommended MRI with contrast of C-spine for further evaluation.  Recommended Medrol Dosepak with close outpatient follow-up MRI with contrast pending upon shift change.   Worrisome signs and symptoms were  discussed with the patient, and the patient acknowledged understanding to return to the ED if noticed. Patient was stable upon shift change. Handoff to Dr. Sondra Come.        Wilnette Kales, [place] 08/02/21 [place], [place], DO 08/02/21 [place], [place] 08/02/21 [place], [place], DO 08/03/21 [phone]

## 2021-08-02 NOTE — ED Notes (Signed)
Spoke with MD wickline. Neuro is intact at this time.

## 2021-08-02 NOTE — ED Triage Notes (Addendum)
Pt reports he is here today with sudden onset of dizziness,cp  1 hour PTA. Pt states he also developed left arm numbness and cp. Pt denies any LOC,Pt denies sob but reports he just doesn't feel good.

## 2021-08-02 NOTE — ED Provider Notes (Signed)
[facility] EMERGENCY DEPARTMENT Provider Note   CSN: [id number] Arrival date & time: 08/02/21  0040     History  Chief Complaint  Patient presents with   Chest Pain   Dizziness    Donald Reese is a 46 y.o. male who presents to the emergency department with chief complaint of chest pain and paresthesia.  Complains that he has been "feeling funny" in his chest for about 1 week.  He complains of a sensation of soreness and tightness in his back and left chest.  This is worsened by changing position, moving sitting in an upright position, better when laying flat and with rest.  He also complains that while he was sitting watching TV this evening he had sudden onset of a feeling of "lightness" as if his entire body were going to float and then had onset of left arm numbness.  He complains of numbness in the entire left arm.  He continues to have some numbness there but it has improved.  He denies headache, changes in vision, nausea, vomiting, cold sweats.  He denies unilateral weakness, difficulty with speech or swallowing.  He has a history of hypertension for which she takes blood pressure medication, PTSD.  He denies a history of smoking, family history of primary relative with CAD or MI, diabetes or hyperlipidemia.   Chest Pain Associated symptoms: dizziness   Dizziness Associated symptoms: chest pain       Home Medications Prior to Admission medications   Medication Sig Start Date End Date Taking? Authorizing Provider  busPIRone (BUSPAR) 10 MG tablet Take by mouth.    [provider]  cloNIDine (CATAPRES) 0.1 MG tablet Take by mouth. 07/16/20   [provider]  cyclobenzaprine (FLEXERIL) 10 MG tablet Take 1 tablet (10 mg total) by mouth 2 (two) times daily as needed for muscle spasms. Do not take while driving or operating heavy machinery 07/04/21   Valentino Nose, NP  escitalopram (LEXAPRO) 10 MG tablet Take by mouth.    [provider]   Lurasidone HCl 120 MG TABS Take by mouth.    [provider]  melatonin 3 MG TABS tablet Take by mouth.    [provider]  traZODone (DESYREL) 100 MG tablet Take by mouth. 10/30/20   [provider]  zolpidem (AMBIEN) 5 MG tablet Take 5 mg by mouth at bedtime as needed for sleep.    [provider]      Allergies    Iodine    Review of Systems   Review of Systems  Cardiovascular:  Positive for chest pain.  Neurological:  Positive for dizziness.   Physical Exam Updated Vital Signs BP (!) 144/87   Pulse (!) 57   Temp 97.9 F (36.6 C) (Oral)   Resp 19   SpO2 94%  Physical Exam Vitals and nursing note reviewed.  Constitutional:      General: He is not in acute distress.    Appearance: He is well-developed. He is not diaphoretic.  HENT:     Head: Normocephalic and atraumatic.  Eyes:     General: No scleral icterus.    Conjunctiva/sclera: Conjunctivae normal.  Neck:     Comments: Lipoma present over the left lower neck around C7 Cardiovascular:     Rate and Rhythm: Normal rate and regular rhythm.     Heart sounds: Normal heart sounds.  Pulmonary:     Effort: Pulmonary effort is normal. No respiratory distress.     Breath  sounds: Normal breath sounds.  Chest:     Comments: Reproducible left-sided pectoralis and chest discomfort associated with tenderness and spasm in the mid thoracic back. Abdominal:     Palpations: Abdomen is soft.     Tenderness: There is no abdominal tenderness.  Musculoskeletal:     Cervical back: Normal range of motion and neck supple.  Skin:    General: Skin is warm and dry.  Neurological:     Mental Status: He is alert.  Psychiatric:        Behavior: Behavior normal.    ED Results / Procedures / Treatments   Labs (all labs ordered are listed, but only abnormal results are displayed) Labs Reviewed  BASIC METABOLIC PANEL - Abnormal; Notable for the following components:      Result Value   Potassium 3.4  (*)    Glucose, Bld 134 (*)    All other components within normal limits  CBC  TROPONIN I (HIGH SENSITIVITY)    EKG EKG Interpretation  Date/Time:  Saturday August 02 2021 00:43:35 EDT Ventricular Rate:  57 PR Interval:  206 QRS Duration: 172 QT Interval:  422 QTC Calculation: 410 R Axis:   -77 Text Interpretation: Sinus bradycardia Right bundle branch block Left anterior fascicular block  Bifascicular block  Abnormal ECG When compared with ECG of 19-Aug-2018 15:09, No significant change since last tracing Confirmed by Zadie Rhine ([id number]) on 08/02/2021 12:54:56 AM  Radiology No results found.  Procedures Procedures    Medications Ordered in ED Medications - No data to display  ED Course/ Medical Decision Making/ A&P Clinical Course as of 08/02/21 0704  Sat Aug 02, 2021  0517 Troponin I (High Sensitivity) Troponin within normal limits [AH]  0517 CBC [AH]  0517 Basic metabolic panel(!) BMP with mildly elevated blood glucose, minimal hypokalemia of insignificant value [AH]  0517 CT Cervical Spine Wo Contrast [AH]  0517 CT Head Wo Contrast I visualized and interpreted CT head and C-spine, no acute findings [AH]  0517 DG Chest 2 View I visualized and interpreted 2 view chest x-ray shows no acute findings [AH]  0517 EKG 12-Lead Unchanged from previous tracing [AH]    Clinical Course User Index [AH] Arthor Captain, PA-C                           Medical Decision Making 46 year old male here with complaint of chest discomfort.  He appears to have reproducible chest wall pain I have extremely low suspicion for ACS. Patient has 2 negative troponins and reassuring EKG.  Regarding his chest discomfort we will have the patient follow-up with outpatient cardiology. She is also having paresthesia of the left. The differential diagnosis of paresthesias includes but is not limited EG:[id number], diabetic neuropathy, es mellitus, entrapment neuropathy, eg, carpal tunnel syndrome,  tarsal tunnel syndrome, meralgia paresthetica) hypocalcemia, multiple sclerosis, spinal cord lesion, nerve root compression, herpes zoster, transient ischemic attack, Guillain-Barr syndrome, trigeminal neuralgia, migraine, partial seizure, reflex sympathetic dystrophy, thoracic outlet syndrome, brachial plexus neuropathy. Given his symptoms will obtain MRI head and C-spine.  He is currently awaiting results.  Signout given to [place].  Plan to have the patient follow-up with outpatient neurology if these are negative.  Amount and/or Complexity of Data Reviewed Labs: ordered. Decision-making details documented in ED Course. Radiology: ordered. Decision-making details documented in ED Course. ECG/medicine tests:  Decision-making details documented in ED Course.  Risk Prescription drug management.      Final Clinical  Impression(s) / ED Diagnoses Final diagnoses:  Chest wall symptom or complaint  Paresthesia of left arm    Rx / DC Orders ED Discharge Orders     None         Arthor CaptainHarris, Nyree Yonker, PA-C 08/02/21 [id number]    Zadie RhineWickline, Donald, MD 08/02/21 2322

## 2021-08-02 NOTE — Discharge Instructions (Addendum)
Please follow-up with Verlin Dike, NP outpatient neurosurgery - her information is attached..  They are aware of your current hospital visit as well as imaging results.  Take the Medrol Dosepak for the next 6 days as directed.

## 2022-07-13 ENCOUNTER — Emergency Department (HOSPITAL_COMMUNITY): Payer: No Typology Code available for payment source

## 2022-07-13 ENCOUNTER — Emergency Department (HOSPITAL_COMMUNITY)
Admission: EM | Admit: 2022-07-13 | Discharge: 2022-07-14 | Disposition: A | Discharge status: Home / Self Care | Payer: No Typology Code available for payment source | Attending: Emergency Medicine | Admitting: Emergency Medicine

## 2022-07-13 ENCOUNTER — Other Ambulatory Visit: Payer: Self-pay

## 2022-07-13 DIAGNOSIS — R55 Syncope and collapse: Secondary | ICD-10-CM

## 2022-07-13 DIAGNOSIS — R079 Chest pain, unspecified: Secondary | ICD-10-CM | POA: Diagnosis present

## 2022-07-13 LAB — BASIC METABOLIC PANEL
Anion gap: 10 (ref 5–15)
BUN: 12 mg/dL (ref 6–20)
CO2: 23 mmol/L (ref 22–32)
Calcium: 9.5 mg/dL (ref 8.9–10.3)
Chloride: 105 mmol/L (ref 98–111)
Creatinine, Ser: 0.86 mg/dL (ref 0.61–1.24)
GFR, Estimated: >60 mL/min (ref >60)
Glucose, Bld: 131 mg/dL — ABNORMAL HIGH (ref 70–99)
Potassium: 3.4 mmol/L — ABNORMAL LOW (ref 3.5–5.1)
Sodium: 138 mmol/L (ref 135–145)

## 2022-07-13 LAB — CBC
HCT: 41.7 % (ref 39.0–52.0)
Hemoglobin: 14.2 g/dL (ref 13.0–17.0)
MCH: 30 pg (ref 26.0–34.0)
MCHC: 34.1 g/dL (ref 30.0–36.0)
MCV: 88 fL (ref 80.0–100.0)
Platelets: 238 10*3/uL (ref 150–400)
RBC: 4.74 MIL/uL (ref 4.22–5.81)
RDW: 12.8 % (ref 11.5–15.5)
WBC: 10.4 10*3/uL (ref 4.0–10.5)
nRBC: 0 % (ref 0.0–0.2)

## 2022-07-13 LAB — TROPONIN I (HIGH SENSITIVITY)
Troponin I (High Sensitivity): 4 ng/L (ref <18)
Troponin I (High Sensitivity): 5 ng/L (ref <18)

## 2022-07-13 NOTE — Discharge Instructions (Addendum)
The test today in the ED were reassuring.  Follow-up with a cardiologist for further evaluation as we discussed

## 2022-07-13 NOTE — ED Provider Notes (Signed)
[place] EMERGENCY DEPARTMENT AT [facility] Provider Note   CSN: [id number] Arrival date & time: 07/13/22  1858     History  Chief Complaint  Patient presents with   Numbness    Donald Reese is a 47 y.o. male.  HPI   Pt started to feel lightheaded and short of breath while sitting watching tv.  Pt then started having tightness in his chest and numbness on his left side.  He thought he was going to pass out.  The symptoms increased over the next hour.  Since waiting the sx have decreased but he still feels weak, drained.  No trouble moving extremities.  No trouble with speech.    Home Medications Prior to Admission medications   Medication Sig Start Date End Date Taking? Authorizing Provider  amLODipine (NORVASC) 5 MG tablet Take 5 mg by mouth daily.    [provider]  busPIRone (BUSPAR) 10 MG tablet Take by mouth.    [provider]  cloNIDine (CATAPRES) 0.1 MG tablet Take by mouth. 07/16/20   [provider]  cyclobenzaprine (FLEXERIL) 10 MG tablet Take 1 tablet (10 mg total) by mouth 2 (two) times daily as needed for muscle spasms. Do not take while driving or operating heavy machinery 07/04/21   Valentino Nose, NP  escitalopram (LEXAPRO) 10 MG tablet Take by mouth.    [provider]  Lurasidone HCl 120 MG TABS Take by mouth.    [provider]  melatonin 3 MG TABS tablet Take by mouth.    [provider]  traZODone (DESYREL) 100 MG tablet Take by mouth. 10/30/20   [provider]  zolpidem (AMBIEN) 5 MG tablet Take 5 mg by mouth at bedtime as needed for sleep.    [provider]      Allergies    Iodine    Review of Systems   Review of Systems  Physical Exam Updated Vital Signs BP 124/81   Pulse 62   Temp 98 F (36.7 C) (Oral)   Resp 19   Ht 1.753 m (5\' 9" )   Wt 111.1 kg   SpO2 97%   BMI 36.18 kg/m  Physical Exam Vitals and nursing note reviewed.  Constitutional:       General: He is not in acute distress.    Appearance: He is well-developed.  HENT:     Head: Normocephalic and atraumatic.     Right Ear: External ear normal.     Left Ear: External ear normal.  Eyes:     General: No visual field deficit or scleral icterus.       Right eye: No discharge.        Left eye: No discharge.     Conjunctiva/sclera: Conjunctivae normal.  Neck:     Trachea: No tracheal deviation.  Cardiovascular:     Rate and Rhythm: Normal rate and regular rhythm.  Pulmonary:     Effort: Pulmonary effort is normal. No respiratory distress.     Breath sounds: Normal breath sounds. No stridor. No wheezing or rales.  Abdominal:     General: Bowel sounds are normal. There is no distension.     Palpations: Abdomen is soft.     Tenderness: There is no abdominal tenderness. There is no guarding or rebound.  Musculoskeletal:        General: No tenderness.     Cervical back: Neck supple.  Skin:    General: Skin is warm and dry.  Findings: No rash.  Neurological:     Mental Status: He is alert and oriented to person, place, and time.     Cranial Nerves: No cranial nerve deficit, dysarthria or facial asymmetry.     Sensory: No sensory deficit.     Motor: No abnormal muscle tone, seizure activity or pronator drift.     Coordination: Coordination normal.     Comments:  able to hold both legs off bed for 5 seconds, sensation intact in all extremities,  no left or right sided neglect, normal finger-nose exam bilaterally, no nystagmus noted   Psychiatric:        Mood and Affect: Mood normal.     ED Results / Procedures / Treatments   Labs (all labs ordered are listed, but only abnormal results are displayed) Labs Reviewed  BASIC METABOLIC PANEL - Abnormal; Notable for the following components:      Result Value   Potassium 3.4 (*)    Glucose, Bld 131 (*)    All other components within normal limits  CBC  TROPONIN I (HIGH SENSITIVITY)  TROPONIN I (HIGH SENSITIVITY)     EKG EKG Interpretation  Date/Time:  Monday Jul 13 2022 19:36:31 EDT Ventricular Rate:  61 PR Interval:  201 QRS Duration: 151 QT Interval:  431 QTC Calculation: 435 R Axis:   -85 Text Interpretation: Sinus rhythm RBBB and LAFB Confirmed by Linwood Dibbles [phone]) on 07/13/2022 9:35:21 PM  Radiology DG Chest 2 View  Result Date: 07/13/2022 CLINICAL DATA:  Chest pain. EXAM: CHEST - 2 VIEW COMPARISON:  08/02/2021. FINDINGS: Low lung volumes accentuate the pulmonary vasculature and cardiomediastinal silhouette. No consolidation or pulmonary edema. No pleural effusion or pneumothorax. Visualized bones and upper abdomen are unremarkable. IMPRESSION: Low lung volumes without acute cardiopulmonary disease. Electronically Signed   By: Orvan Falconer M.D.   On: 07/13/2022 19:47    Procedures Procedures    Medications Ordered in ED Medications - No data to display  ED Course/ Medical Decision Making/ A&P Clinical Course as of 07/14/22 0001  Mon Jul 13, 2022  2129 Basic metabolic panel(!) Slight decrease in potassium [JK]  2130 Troponin I (High Sensitivity) Normal  [JK]  2130 CBC nl [JK]    Clinical Course User Index [JK] Linwood Dibbles, MD                             Medical Decision Making Problems Addressed: Chest pain, unspecified type: acute illness or injury that poses a threat to life or bodily functions Near syncope: acute illness or injury that poses a threat to life or bodily functions  Amount and/or Complexity of Data Reviewed Labs: ordered. Decision-making details documented in ED Course. Radiology: ordered and independent interpretation performed.   Patient presented to ED with complaints of near syncopal symptoms.  This is associated with some numbness and chest discomfort.  ED workup reassuring.  Serial troponins are normal.  I doubt acute coronary syndrome pulmonary embolism.  Patient has a normal neurologic exam.  No findings to suggest stroke or TIA.  Patient  states his initial symptoms of lightheadedness.  Does not describe palpitations but it is possible he could have had some type of dysrhythmia.  None noted in the ED.  Patient appears stable for discharge but I do think he would benefit from further outpatient workup, possible echocardiogram, outpatient cardiac monitor.  Will place an ambulatory referral to cardiology.        Final  Clinical Impression(s) / ED Diagnoses Final diagnoses:  Chest pain, unspecified type  Near syncope    Rx / DC Orders ED Discharge Orders          Ordered    Ambulatory referral to Cardiology       Comments: If you have not heard from the Cardiology office within the next 72 hours please call [phone].   07/14/22 0000              Linwood Dibbles, MD 07/14/22 0002

## 2022-07-13 NOTE — ED Notes (Signed)
Gave pt [place] sandwich and cranberry juice

## 2022-07-13 NOTE — ED Triage Notes (Signed)
Pt reports left sided numbness x 30 min. Also reports dizziness, "feeling sleepy" and chest tightness.
